# Patient Record
Sex: Female | Born: 1960 | Race: Black or African American | Hispanic: No | Marital: Married | Smoking: Current every day smoker
Health system: Southern US, Community
[De-identification: ages and names within clinical notes are randomized; demographics above are authoritative.]

## PROBLEM LIST (undated history)

## (undated) DIAGNOSIS — I10 Essential (primary) hypertension: Secondary | ICD-10-CM

## (undated) DIAGNOSIS — G459 Transient cerebral ischemic attack, unspecified: Secondary | ICD-10-CM

## (undated) DIAGNOSIS — Z720 Tobacco use: Secondary | ICD-10-CM

## (undated) HISTORY — DX: Essential (primary) hypertension: I10

## (undated) HISTORY — PX: OTHER SURGICAL HISTORY: SHX169

## (undated) HISTORY — DX: Transient cerebral ischemic attack, unspecified: G45.9

---

## 1998-03-04 ENCOUNTER — Emergency Department (HOSPITAL_COMMUNITY): Admission: EM | Admit: 1998-03-04 | Discharge: 1998-03-04 | Payer: Self-pay | Admitting: Emergency Medicine

## 1999-02-23 ENCOUNTER — Encounter: Admission: RE | Admit: 1999-02-23 | Discharge: 1999-02-23 | Payer: Self-pay | Admitting: Hematology and Oncology

## 1999-07-03 ENCOUNTER — Encounter: Admission: RE | Admit: 1999-07-03 | Discharge: 1999-07-03 | Payer: Self-pay | Admitting: Internal Medicine

## 1999-11-28 ENCOUNTER — Encounter: Payer: Self-pay | Admitting: Emergency Medicine

## 1999-11-28 ENCOUNTER — Emergency Department (HOSPITAL_COMMUNITY): Admission: EM | Admit: 1999-11-28 | Discharge: 1999-11-28 | Payer: Self-pay | Admitting: Emergency Medicine

## 1999-12-02 ENCOUNTER — Emergency Department (HOSPITAL_COMMUNITY): Admission: EM | Admit: 1999-12-02 | Discharge: 1999-12-02 | Payer: Self-pay | Admitting: Emergency Medicine

## 1999-12-02 ENCOUNTER — Encounter: Payer: Self-pay | Admitting: Emergency Medicine

## 2000-07-10 ENCOUNTER — Emergency Department (HOSPITAL_COMMUNITY): Admission: EM | Admit: 2000-07-10 | Discharge: 2000-07-10 | Payer: Self-pay | Admitting: Emergency Medicine

## 2000-12-03 ENCOUNTER — Encounter: Admission: RE | Admit: 2000-12-03 | Discharge: 2000-12-03 | Payer: Self-pay | Admitting: Hematology and Oncology

## 2000-12-24 ENCOUNTER — Encounter: Admission: RE | Admit: 2000-12-24 | Discharge: 2000-12-24 | Payer: Self-pay | Admitting: Obstetrics & Gynecology

## 2000-12-24 ENCOUNTER — Other Ambulatory Visit: Admission: RE | Admit: 2000-12-24 | Discharge: 2000-12-24 | Payer: Self-pay | Admitting: *Deleted

## 2012-04-20 DIAGNOSIS — G459 Transient cerebral ischemic attack, unspecified: Secondary | ICD-10-CM

## 2012-04-20 HISTORY — DX: Transient cerebral ischemic attack, unspecified: G45.9

## 2013-06-11 ENCOUNTER — Ambulatory Visit: Payer: No Typology Code available for payment source | Attending: Internal Medicine

## 2013-07-01 ENCOUNTER — Encounter: Payer: Self-pay | Admitting: Internal Medicine

## 2013-07-01 ENCOUNTER — Ambulatory Visit: Payer: Self-pay | Attending: Internal Medicine | Admitting: Internal Medicine

## 2013-07-01 VITALS — BP 149/97 | HR 67 | Temp 98.7°F | Resp 16 | Ht 65.0 in | Wt 200.0 lb

## 2013-07-01 DIAGNOSIS — F17200 Nicotine dependence, unspecified, uncomplicated: Secondary | ICD-10-CM | POA: Insufficient documentation

## 2013-07-01 DIAGNOSIS — I10 Essential (primary) hypertension: Secondary | ICD-10-CM

## 2013-07-01 DIAGNOSIS — Z0000 Encounter for general adult medical examination without abnormal findings: Secondary | ICD-10-CM

## 2013-07-01 MED ORDER — ASPIRIN 81 MG PO TABS: 81.0000 mg | ORAL_TABLET | Freq: Every day | ORAL | Status: DC

## 2013-07-01 MED ORDER — CARVEDILOL 6.25 MG PO TABS: 6.2500 mg | ORAL_TABLET | Freq: Two times a day (BID) | ORAL | Status: DC

## 2013-07-01 MED ORDER — LOSARTAN POTASSIUM 100 MG PO TABS: 100.0000 mg | ORAL_TABLET | Freq: Every day | ORAL | Status: DC

## 2013-07-01 MED ORDER — AMLODIPINE BESYLATE 10 MG PO TABS: 10.0000 mg | ORAL_TABLET | Freq: Every day | ORAL | Status: DC

## 2013-07-01 MED ORDER — HYDROCHLOROTHIAZIDE 25 MG PO TABS: 25.0000 mg | ORAL_TABLET | Freq: Every day | ORAL | Status: DC

## 2013-07-01 NOTE — Progress Notes (Signed)
Pt is here to establish care. Pt reports having HTN and she needs a refill of her HTN medication. Pt has no C.C. Today.

## 2013-07-01 NOTE — Progress Notes (Signed)
Patient Demographics  Veronica Turner, is a 53 y.o. female  [id number]  [id number]  DOB - 01-13-1961  Chief Complaint  Patient presents with  . Establish Care        Subjective:   Veronica Turner today is here to establish primary care. Patient has a past medical history of hypertension, ongoing tobacco use who presents here for establishment of primary care. She just ran out of her antihypertensive medications today, and needs refill as well. She has no problems, she recently moved to [place] from [place].  Currently patient has no complaints. Patient has also has No headache, No chest pain, No abdominal pain,No Nausea, No new weakness tingling or numbness, No Cough or SOB.   Objective:    Filed Vitals:   07/01/13 1440  BP: 149/97  Pulse: 67  Temp: 98.7 F (37.1 C)  TempSrc: Oral  Resp: 16  Height: 5\' 5"  (1.651 m)  Weight: 200 lb (90.719 kg)  SpO2: 99%     ALLERGIES:   Allergies  Allergen Reactions  . Penicillins     PAST MEDICAL HISTORY: History reviewed. No pertinent past medical history.  PAST SURGICAL HISTORY: Past Surgical History  Procedure Laterality Date  . Cesarean section      FAMILY HISTORY: Family History  Problem Relation Age of Onset  . Heart disease Mother   . Hypertension Mother     MEDICATIONS AT HOME: Prior to Admission medications   Medication Sig Start Date End Date Taking? Authorizing Provider  amLODipine (NORVASC) 10 MG tablet Take 1 tablet (10 mg total) by mouth daily. 07/01/13   Oretta Berkland Levora Dredge, MD  aspirin 81 MG tablet Take 1 tablet (81 mg total) by mouth daily. 07/01/13   Uma Jerde Levora Dredge, MD  carvedilol (COREG) 6.25 MG tablet Take 1 tablet (6.25 mg total) by mouth 2 (two) times daily with a meal. 07/01/13   Elaisha Zahniser Levora Dredge, MD  hydrochlorothiazide (HYDRODIURIL) 25 MG tablet Take 1 tablet (25 mg total) by mouth daily. 07/01/13   Atha Mcbain Levora Dredge, MD  losartan (COZAAR) 100 MG tablet Take 1 tablet (100 mg  total) by mouth daily. 07/01/13   Lamarius Dirr Levora Dredge, MD    SOCIAL HISTORY:   reports that she has been smoking Cigarettes.  She has been smoking about 0.50 packs per day. She does not have any smokeless tobacco history on file. She reports that she drinks about 0.5 ounces of alcohol per week. She reports that she does not use illicit drugs.  REVIEW OF SYSTEMS:  Constitutional:   No   Fevers, chills, fatigue.  HEENT:    No headaches, Sore throat,   Cardio-vascular: No chest pain,  Orthopnea, swelling in lower extremities, anasarca, palpitations  GI:  No abdominal pain, nausea, vomiting, diarrhea  Resp: No shortness of breath,  No coughing up of blood.No cough.No wheezing.  Skin:  no rash or lesions.  GU:  no dysuria, change in color of urine, no urgency or frequency.  No flank pain.  Musculoskeletal: No joint pain or swelling.  No decreased range of motion.  No back pain.  Psych: No change in mood or affect. No depression or anxiety.  No memory loss.   Exam  General appearance :Awake, alert, not in any distress. Speech Clear. Not toxic Looking HEENT: Atraumatic and Normocephalic, pupils equally reactive to light and accomodation Neck: supple, no JVD. No cervical lymphadenopathy.  Chest:Good air entry bilaterally, no added sounds  CVS: S1 S2 regular, no murmurs.  Abdomen: Bowel sounds present,  Non tender and not distended with no gaurding, rigidity or rebound. Extremities: B/L Lower Ext shows no edema, both legs are warm to touch Neurology: Awake alert, and oriented X 3, CN II-XII intact, Non focal Skin:No Rash Wounds:N/A    Data Review   CBC No results found for this basename: WBC, HGB, HCT, PLT, MCV, MCH, MCHC, RDW, NEUTRABS, LYMPHSABS, MONOABS, EOSABS, BASOSABS, BANDABS, BANDSABD,  in the last 168 hours  Chemistries   No results found for this basename: NA, K, CL, CO2, GLUCOSE, BUN, CREATININE, GFRCGP, CALCIUM, MG, AST, ALT, ALKPHOS, BILITOT,  in the last 168  hours ------------------------------------------------------------------------------------------------------------------ No results found for this basename: HGBA1C,  in the last 72 hours ------------------------------------------------------------------------------------------------------------------ No results found for this basename: CHOL, HDL, LDLCALC, TRIG, CHOLHDL, LDLDIRECT,  in the last 72 hours ------------------------------------------------------------------------------------------------------------------ No results found for this basename: TSH, T4TOTAL, FREET3, T3FREE, THYROIDAB,  in the last 72 hours ------------------------------------------------------------------------------------------------------------------ No results found for this basename: VITAMINB12, FOLATE, FERRITIN, TIBC, IRON, RETICCTPCT,  in the last 72 hours  Coagulation profile  No results found for this basename: INR, PROTIME,  in the last 168 hours    Assessment & Plan   Hypertension - Refill losartan 100 mg daily, refill HCTZ 25 mg daily, refill amlodipine 10 mg daily, increase Coreg to 6.25 mg daily - Reassess next visit and adjust according  Tobacco use - Counseled regarding the importance of quitting  Has family history of premature coronary artery disease - Claims  stress test done recently in [place] in June of this year was negative. - Claims echocardiogram done recently in [place] in June of this year was negative for major abnormalities - Maintained on aspirin  May need referral to cardiology at some point.  For now will check CBC, chemistries, lipid panel, A1c and TSH and have a followup in 3 weeks. Please check these tests at next visit  Health Maintenance -Colonoscopy: Refer to gastroenterology -Pap Smear: Refer to GYN -Mammogram: Have ordered -Vaccinations: Flu vaccine ordered today- however patient refused  Follow up in 3 weeks  The patient was given clear instructions to go to  ER or return to medical center if symptoms don't improve, worsen or new problems develop. The patient verbalized understanding. The patient was told to call to get lab results if they haven't heard anything in the next week.

## 2013-07-06 ENCOUNTER — Other Ambulatory Visit: Payer: Self-pay | Admitting: Internal Medicine

## 2013-07-06 ENCOUNTER — Other Ambulatory Visit: Payer: Self-pay

## 2013-07-06 DIAGNOSIS — Z1231 Encounter for screening mammogram for malignant neoplasm of breast: Secondary | ICD-10-CM

## 2013-07-08 ENCOUNTER — Encounter: Payer: Self-pay | Admitting: Nurse Practitioner

## 2013-07-08 ENCOUNTER — Other Ambulatory Visit: Payer: Self-pay

## 2013-07-31 ENCOUNTER — Ambulatory Visit: Payer: Self-pay

## 2013-08-03 ENCOUNTER — Encounter: Payer: Self-pay | Admitting: Nurse Practitioner

## 2013-08-03 ENCOUNTER — Ambulatory Visit (INDEPENDENT_AMBULATORY_CARE_PROVIDER_SITE_OTHER): Payer: Self-pay | Admitting: Nurse Practitioner

## 2013-08-03 VITALS — BP 130/94 | HR 81 | Temp 98.0°F | Ht 64.0 in | Wt 197.2 lb

## 2013-08-03 DIAGNOSIS — B009 Herpesviral infection, unspecified: Secondary | ICD-10-CM

## 2013-08-03 DIAGNOSIS — I10 Essential (primary) hypertension: Secondary | ICD-10-CM

## 2013-08-03 DIAGNOSIS — N760 Acute vaginitis: Secondary | ICD-10-CM

## 2013-08-03 DIAGNOSIS — A609 Anogenital herpesviral infection, unspecified: Secondary | ICD-10-CM

## 2013-08-03 DIAGNOSIS — E669 Obesity, unspecified: Secondary | ICD-10-CM

## 2013-08-03 DIAGNOSIS — Z0000 Encounter for general adult medical examination without abnormal findings: Secondary | ICD-10-CM

## 2013-08-03 DIAGNOSIS — F17200 Nicotine dependence, unspecified, uncomplicated: Secondary | ICD-10-CM

## 2013-08-03 MED ORDER — FLUCONAZOLE 150 MG PO TABS: 150.0000 mg | ORAL_TABLET | Freq: Every day | ORAL | Status: DC

## 2013-08-03 NOTE — Progress Notes (Signed)
History:  Veronica Turner is a 52 y.o. [id number] who presents to [facility] clinic today for pap smear. She gets a yearly pap. She still has monthly menses. She has had a full physical exam at [facility]. She has not gotten her labs done yet. She has a history of hypertension and they control her medications. She smokes 3 cigarettes per day. Her last mammogram was last week and negative. She is married with same partner for 35 years. Two weeks ago she felt tingling and itch in her vaginal area. It was not very painful.  Husband does have cold sores.  The following portions of the patient's history were reviewed and updated as appropriate: allergies, current medications, past family history, past medical history, past social history, past surgical history and problem list.  Review of Systems:  Pertinent items are noted in HPI.  Objective:  Physical Exam BP 130/94  Pulse 81  Temp(Src) 98 F (36.7 C) (Oral)  Ht 5\' 4"  (1.626 m)  Wt 197 lb 3.2 oz (89.449 kg)  BMI 33.83 kg/m2  LMP 07/24/2013 GENERAL: Well-developed, well-nourished female in no acute distress.  HEENT: Normocephalic, atraumatic.  NECK: Supple. Normal thyroid.  LUNGS: Normal rate. Clear to auscultation bilaterally.  HEART: Regular rate and rhythm with no adventitious sounds.  BREASTS: Symmetric in size. No masses, skin changes, nipple drainage, or lymphadenopathy. ABDOMEN: Soft, nontender, nondistended. No organomegaly. Normal bowel sounds appreciated in all quadrants.  PELVIC: External labia there are on right side approx 4 mm cuts/ blisters. Vagina is pink and rugated.  Normal discharge. Normal cervix contour. Pap smear obtained. Uterus is normal in size. No adnexal mass or tenderness.  EXTREMITIES: No cyanosis, clubbing, or edema, 2+ distal pulses.   Labs and Imaging No results found.  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 Vaginal yeast    Plans:  Pt insists on  blood for HSV typing Will await results  Diflucan 150 mg ome time dose   Delbert Phenix, NP 08/03/2013 4:26 PM

## 2013-08-03 NOTE — Patient Instructions (Signed)
Herpes Labialis  You have a fever blister or cold sore (herpes labialis). These painful, grouped sores are caused by one of the herpes viruses (HSV1 most commonly). They are usually found around the lips and mouth, but the same infection can also affect other areas on the face such as the nose and eyes. Herpes infections take about 10 days to heal. They often occur again and again in the same spot. Other symptoms may include numbness and tingling in the involved skin, achiness, fever, and swollen glands in the neck. Colds, emotional stress, injuries, or excess sunlight exposure all seem to make herpes reappear. Herpes lip infections are contagious. Direct contact with these sores can spread the infection. It can also be spread to other parts of your own body.  TREATMENT   Herpes labialis is usually self-limited and resolves within 1 week. To reduce pain and swelling, apply ice packs frequently to the sores or suck on popsicles or frozen juice bars. Antiviral medicine may be used by mouth to shorten the duration of the breakout. Avoid spreading the infection by washing your hands often. Be careful not to touch your eyes or genital areas after handling the infected blisters. Do not kiss or have other intimate contact with others. After the blisters are completely healed you may resume contact. Use sunscreen to lessen recurrences.   If this is your first infection with herpes, or if you have a severe or repeated infections, your caregiver may prescribe one of the anti-viral drugs to speed up the healing. If you have sun-related flare-ups despite the use of sunscreen, starting oral anti-viral medicine before a prolonged exposure (going skiing or to the beach) can prevent most episodes.   SEEK IMMEDIATE MEDICAL CARE IF:  · You develop a headache, sleepiness, high fever, vomiting, or severe weakness.  · You have eye irritation, pain, blurred vision or redness.  · You develop a prolonged infection not getting better in 10  days.  Document Released: 08/06/2005 Document Revised: 10/29/2011 Document Reviewed: 06/10/2009  Exit[employer]® Patient Information ©2014 [employer].

## 2013-08-04 LAB — HSV(HERPES SMPLX)ABS-I+II(IGG+IGM)-BLD
HSV 1 Glycoprotein G Ab, IgG: 6.25 IV — ABNORMAL HIGH
Herpes Simplex Vrs I&II-IgM Ab (EIA): 0.53 INDEX

## 2013-08-05 ENCOUNTER — Ambulatory Visit
Admission: RE | Admit: 2013-08-05 | Discharge: 2013-08-05 | Disposition: A | Discharge status: Home / Self Care | Payer: No Typology Code available for payment source | Source: Ambulatory Visit | Attending: Internal Medicine | Admitting: Internal Medicine

## 2013-08-05 ENCOUNTER — Telehealth: Payer: Self-pay | Admitting: General Practice

## 2013-08-05 ENCOUNTER — Telehealth: Payer: Self-pay | Admitting: Nurse Practitioner

## 2013-08-05 DIAGNOSIS — Z1231 Encounter for screening mammogram for malignant neoplasm of breast: Secondary | ICD-10-CM

## 2013-08-05 DIAGNOSIS — A609 Anogenital herpesviral infection, unspecified: Secondary | ICD-10-CM

## 2013-08-05 MED ORDER — VALACYCLOVIR HCL 1 G PO TABS: 1000.0000 mg | ORAL_TABLET | Freq: Two times a day (BID) | ORAL | Status: AC

## 2013-08-05 NOTE — Telephone Encounter (Signed)
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 Will send in rx for Valtrex 1 Gram po BID x 5 days with refills. Information on HSV provided

## 2013-08-05 NOTE — Telephone Encounter (Signed)
Patient called and left message stating she spoke with linda barefoot this morning and she was supposed to be calling in a medication for her but she went to her pharmacy and it wasn't there, would like a call back. Per chart review, it appears medication Rx printed. Called pharmacy and phoned in medication. Called patient, no answer- left message stating I was returning her phone call and have called in her Rx that should be available to pickup in a hour and I apologize for the inconvenience and to call us back if she has additional questions or concerns

## 2013-08-27 ENCOUNTER — Encounter: Payer: Self-pay | Admitting: Internal Medicine

## 2014-01-15 ENCOUNTER — Ambulatory Visit: Payer: Self-pay | Attending: Internal Medicine

## 2014-01-16 ENCOUNTER — Emergency Department (HOSPITAL_COMMUNITY)
Admission: EM | Admit: 2014-01-16 | Discharge: 2014-01-16 | Disposition: A | Discharge status: Home / Self Care | Payer: Self-pay | Source: Home / Self Care

## 2014-01-16 ENCOUNTER — Encounter (HOSPITAL_COMMUNITY): Payer: Self-pay | Admitting: Emergency Medicine

## 2014-01-16 DIAGNOSIS — W268XXA Contact with other sharp object(s), not elsewhere classified, initial encounter: Secondary | ICD-10-CM

## 2014-01-16 DIAGNOSIS — S60929A Unspecified superficial injury of unspecified hand, initial encounter: Secondary | ICD-10-CM

## 2014-01-16 DIAGNOSIS — Z23 Encounter for immunization: Secondary | ICD-10-CM

## 2014-01-16 DIAGNOSIS — S61411A Laceration without foreign body of right hand, initial encounter: Secondary | ICD-10-CM

## 2014-01-16 DIAGNOSIS — Y93E9 Activity, other interior property and clothing maintenance: Secondary | ICD-10-CM

## 2014-01-16 MED ORDER — TETANUS-DIPHTH-ACELL PERTUSSIS 5-2.5-18.5 LF-MCG/0.5 IM SUSP
0.5000 mL | Freq: Once | INTRAMUSCULAR | Status: AC
  Administered 2014-01-16: 0.5 mL via INTRAMUSCULAR

## 2014-01-16 MED ORDER — TETANUS-DIPHTH-ACELL PERTUSSIS 5-2.5-18.5 LF-MCG/0.5 IM SUSP
INTRAMUSCULAR | Status: AC
  Filled 2014-01-16: qty 0.5

## 2014-01-16 NOTE — ED Notes (Signed)
Pt states that the incident happened last night.   Mw,cma

## 2014-01-16 NOTE — ED Provider Notes (Signed)
CSN: [id number]     Arrival date & time 01/16/14  1245 History   First MD Initiated Contact with Patient 01/16/14 1406     Chief Complaint  Patient presents with  . Laceration   (Consider location/radiation/quality/duration/timing/severity/associated sxs/prior Treatment) HPI Comments: While cleaning windows at 2 AM today the glass broke and she received a superficial laceration to the right hand and abrasion to right wrist. Does not recall last tetanus vac.   Past Medical History  Diagnosis Date  . Hypertension    Past Surgical History  Procedure Laterality Date  . Cesarean section     Family History  Problem Relation Age of Onset  . Heart disease Mother   . Hypertension Mother    History  Substance Use Topics  . Smoking status: Current Every Day Smoker -- 0.50 packs/day    Types: Cigarettes  . Smokeless tobacco: Not on file  . Alcohol Use: 0.5 oz/week    1 drink(s) per week   OB History   Grav Para Term Preterm Abortions TAB SAB Ect Mult Living   7 5 3 2 2  0 2 0 0 5     Review of Systems  Constitutional: Negative.   Respiratory: Negative.   Skin: Positive for wound.       As per HPI    Allergies  Penicillins  Home Medications   Prior to Admission medications   Medication Sig Start Date End Date Taking? Authorizing Provider  amLODipine (NORVASC) 10 MG tablet Take 1 tablet (10 mg total) by mouth daily. 07/01/13  Yes Shanker Levora DredgeM Ghimire, MD  aspirin 81 MG tablet Take 1 tablet (81 mg total) by mouth daily. 07/01/13  Yes Shanker Levora DredgeM Ghimire, MD  carvedilol (COREG) 6.25 MG tablet Take 1 tablet (6.25 mg total) by mouth 2 (two) times daily with a meal. 07/01/13  Yes Shanker Levora DredgeM Ghimire, MD  fluconazole (DIFLUCAN) 150 MG tablet Take 1 tablet (150 mg total) by mouth daily. 08/03/13  Yes Delbert PhenixLinda M Barefoot, NP  hydrochlorothiazide (HYDRODIURIL) 25 MG tablet Take 1 tablet (25 mg total) by mouth daily. 07/01/13  Yes Shanker Levora DredgeM Ghimire, MD  losartan (COZAAR) 100 MG tablet Take 1  tablet (100 mg total) by mouth daily. 07/01/13  Yes Shanker Levora DredgeM Ghimire, MD   BP 209/125  Pulse 71  Temp(Src) 98 F (36.7 C) (Oral)  Resp 18  SpO2 96%  LMP 12/17/2013 Physical Exam  Nursing note and vitals reviewed. Constitutional: She is oriented to person, place, and time. She appears well-developed and well-nourished. No distress.  Musculoskeletal: She exhibits no edema and no tenderness.  Full ROM of the R hand, digits. 5th digit with full extension against resistance  Neurological: She is alert and oriented to person, place, and time.  Skin: Skin is warm and dry.  1.25 cm superficial laceration to the dorsum of the right hand proximal to the 5th digit. Involves dermis only.  Superficial abrasion to ulnar aspect right wrist. No bleeding.   Psychiatric: She has a normal mood and affect.    ED Course  LACERATION REPAIR Date/Time: 01/16/2014 2:31 PM Performed by: Phineas RealMABE, Nioma Mccubbins Authorized by: Leslee HomeKELLER, Etan Vasudevan C Consent: Verbal consent obtained. Risks and benefits: risks, benefits and alternatives were discussed Consent given by: patient Patient understanding: patient states understanding of the procedure being performed Patient identity confirmed: verbally with patient Body area: upper extremity Location details: right hand Laceration length: 1.3 cm Tendon involvement: none Nerve involvement: none Vascular damage: no Patient sedated: no Irrigation solution: tap water  Amount of cleaning: standard Debridement: none Degree of undermining: none Skin closure: glue Technique: simple Approximation: close Approximation difficulty: simple Patient tolerance: Patient tolerated the procedure well with no immediate complications. Comments: After betadine soak for 10 min applied glue for closure.   (including critical care time) Labs Review Labs Reviewed - No data to display  Imaging Review No results found.   MDM   1. Superficial laceration of right hand     Wound  cleaned Closed with glue INstructions for care. Tdap ordered    Hayden Rasmussen, NP 01/16/14 1438

## 2014-01-16 NOTE — ED Notes (Signed)
Reports to lacerations to the right hand pinky side.  States was cleaning windows and glass broke.  Pt is up to date on tetanus.

## 2014-01-17 NOTE — ED Provider Notes (Signed)
Medical screening examination/treatment/procedure(s) were performed by non-physician practitioner and as supervising physician I was immediately available for consultation/collaboration.  Leslee Home, M.D.  Reuben Likes, MD 01/17/14 [phone]

## 2014-01-20 ENCOUNTER — Ambulatory Visit: Payer: Self-pay | Attending: Internal Medicine | Admitting: Internal Medicine

## 2014-01-20 ENCOUNTER — Encounter: Payer: Self-pay | Admitting: Internal Medicine

## 2014-01-20 VITALS — BP 167/95 | HR 75 | Temp 98.1°F | Resp 20 | Ht 64.0 in | Wt 203.0 lb

## 2014-01-20 DIAGNOSIS — I209 Angina pectoris, unspecified: Secondary | ICD-10-CM | POA: Insufficient documentation

## 2014-01-20 DIAGNOSIS — Z7982 Long term (current) use of aspirin: Secondary | ICD-10-CM | POA: Insufficient documentation

## 2014-01-20 DIAGNOSIS — F17200 Nicotine dependence, unspecified, uncomplicated: Secondary | ICD-10-CM | POA: Insufficient documentation

## 2014-01-20 DIAGNOSIS — Z8673 Personal history of transient ischemic attack (TIA), and cerebral infarction without residual deficits: Secondary | ICD-10-CM | POA: Insufficient documentation

## 2014-01-20 DIAGNOSIS — I10 Essential (primary) hypertension: Secondary | ICD-10-CM | POA: Insufficient documentation

## 2014-01-20 MED ORDER — ISOSORBIDE MONONITRATE ER 30 MG PO TB24: 30.0000 mg | ORAL_TABLET | Freq: Every day | ORAL | Status: DC

## 2014-01-20 MED ORDER — BUPROPION HCL ER (SR) 150 MG PO TB12: 150.0000 mg | ORAL_TABLET | Freq: Every day | ORAL | Status: DC

## 2014-01-20 NOTE — Patient Instructions (Signed)
Angina Pectoris  Angina pectoris, often just called angina, is extreme discomfort in your chest, neck, or arm caused by a lack of blood in the middle and thickest layer of your heart wall (myocardium). It may feel like tightness or heavy pressure. It may feel like a crushing or squeezing pain. Some people say it feels like gas or indigestion. It may go down your shoulders, back, and arms. Some people may have symptoms other than pain. These symptoms include fatigue, shortness of breath, cold sweats, or nausea. There are four different types of angina:   Stable angina Stable angina usually occurs in episodes of predictable frequency and duration. It usually is brought on by physical activity, emotional stress, or excitement. These are all times when the myocardium needs more oxygen. Stable angina usually lasts a few minutes and often is relieved by taking a medicine that can be taken under your tongue (sublingually). The medicine is called nitroglycerin. Stable angina is caused by a buildup of plaque inside the arteries, which restricts blood flow to the heart muscle (atherosclerosis).   Unstable angina Unstable angina can occur even when your body experiences little or no physical exertion. It can occur during sleep. It can also occur at rest. It can suddenly increase in severity or frequency. It might not be relieved by sublingual nitroglycerin. It can last up to 30 minutes. The most common cause of unstable angina is a blood clot that has developed on the top of plaque buildup inside a coronary artery. It can lead to a heart attack if the blood clot completely blocks the artery.   Microvascular angina This type of angina is caused by a disorder of tiny blood vessels called arterioles. Microvascular angina is more common in women. The pain may be more severe and last longer than other types of angina pectoris.   Prinzmetal or variant angina This type of angina pectoris usually occurs when your body experiences  little or no physical exertion. It especially occurs in the early morning hours. It is caused by a spasm of your coronary artery.  HOME CARE INSTRUCTIONS    Only take over-the-counter and prescription medicines as directed by your caregiver.   Stay active or increase your exercise as directed by your caregiver.   Limit strenuous activity as directed by your caregiver.   Limit heavy lifting as directed by your caregiver.   Maintain a healthy weight.   Learn about and eat heart-healthy foods.   Do not smoke.  SEEK IMMEDIATE MEDICAL CARE IF:   You experience the following symptoms:   Chest, neck, deep shoulder, or arm pain or discomfort that lasts more than a few minutes.   Chest, neck, deep shoulder, or arm pain or discomfort that goes away and comes back, repeatedly.   Heavy sweating with discomfort, without a noticeable cause.   Shortness of breath or difficulty breathing.   Angina that does not get better after a few minutes of rest or after taking sublingual nitroglycerin.  These can all be symptoms of a heart attack, which is a medical emergency! Get medical help at once. Call your local emergency service (911 in [place] immediately. Do not  drive yourself to the hospital and do not  wait to for your symptoms to go away.  MAKE SURE YOU:   Understand these instructions.   Will watch your condition.   Will get help right away if you are not doing well or get worse.  Document Released: 08/06/2005 Document Revised: 07/23/2012 Document Reviewed:   05/15/2012  Exit[employer] Patient Information 2014 [employer].

## 2014-01-20 NOTE — Progress Notes (Signed)
Patient ID: Veronica Turner, female   DOB: 1961-04-15, 53 y.o.   MRN: [id number]  CC: HTN, intermittent chest pain  HPI: Patient presents today for HTNand intermittent chest pain.  Patient reports chest heaviness with ambulation.  She states the pain began one week ago and it has been hurting intermittently on a daily basis.  She notices the pain and shortness of breath while doing house work. She reports some sharp pains that radiate to her back that are promptly relieved with rest and ibuprofen.  She is a current smoker.  She reports that she has been experiencing more stress over the past few weeks and is worried because her mom passed away from a MI at a young age.  She states that she was being followed by a Cardiologist before moving to [place] and had similar symptoms in the past and was started on Coreg which relieved the chest pain.   She also reports a history of a TIA last September before moving here.  She takes 81 mg of aspirin daily.    She c/o nasal discharge and cough with thick white mucous production. Denies fevers or chills.  Allergies  Allergen Reactions  . Penicillins    Past Medical History  Diagnosis Date  . Hypertension   . TIA (transient ischemic attack) 04/20/12   Current Outpatient Prescriptions on File Prior to Visit  Medication Sig Dispense Refill  . amLODipine (NORVASC) 10 MG tablet Take 1 tablet (10 mg total) by mouth daily.  90 tablet  3  . aspirin 81 MG tablet Take 1 tablet (81 mg total) by mouth daily.  30 tablet  3  . carvedilol (COREG) 6.25 MG tablet Take 1 tablet (6.25 mg total) by mouth 2 (two) times daily with a meal.  60 tablet  3  . hydrochlorothiazide (HYDRODIURIL) 25 MG tablet Take 1 tablet (25 mg total) by mouth daily.  90 tablet  3  . losartan (COZAAR) 100 MG tablet Take 1 tablet (100 mg total) by mouth daily.  90 tablet  3  . fluconazole (DIFLUCAN) 150 MG tablet Take 1 tablet (150 mg total) by mouth daily.  1 tablet  1   No current facility-administered  medications on file prior to visit.   Family History  Problem Relation Age of Onset  . Heart disease Mother   . Hypertension Mother   . Cancer Father    History   Social History  . Marital Status: Married    Spouse Name: N/A    Number of Children: N/A  . Years of Education: N/A   Occupational History  . Not on file.   Social History Main Topics  . Smoking status: Current Every Day Smoker -- 0.50 packs/day    Types: Cigarettes  . Smokeless tobacco: Not on file  . Alcohol Use: 0.5 oz/week    1 drink(s) per week  . Drug Use: No  . Sexual Activity: Not on file   Other Topics Concern  . Not on file   Social History Narrative  . No narrative on file    Review of Systems: See HPI   Objective:   Filed Vitals:   01/20/14 0949  BP: 167/95  Pulse: 75  Temp: 98.1 F (36.7 C)  Resp: 20   Physical Exam  Vitals reviewed. Constitutional: She is oriented to person, place, and time. She appears well-nourished.  HENT:  Right Ear: External ear normal.  Left Ear: External ear normal.  Nose: Nose normal.  Mouth/Throat: Oropharynx is clear  and moist.  Eyes: Pupils are equal, round, and reactive to light.  Neck: Normal range of motion. Neck supple. No JVD present.  Cardiovascular: Normal rate, regular rhythm, normal heart sounds and intact distal pulses.   Pulmonary/Chest: Effort normal and breath sounds normal.  Abdominal: Soft. Bowel sounds are normal.  Musculoskeletal: Normal range of motion. She exhibits no edema and no tenderness.  Lymphadenopathy:    She has no cervical adenopathy.  Neurological: She is alert and oriented to person, place, and time. She has normal reflexes.  Skin: Skin is warm and dry.    No results found for this basename: WBC, HGB, HCT, MCV, PLT   No results found for this basename: CREATININE, BUN, NA, K, CL, CO2    No results found for this basename: HGBA1C   Lipid Panel  No results found for this basename: chol, trig, hdl, cholhdl,  vldl, ldlcalc       Assessment and plan:  Drinda Buttsnnette was seen today for follow-up, hypertension, chest pain and back pain.  Diagnoses and associated orders for this visit:  Other and unspecified angina pectoris Will add isosorbide mononitrate (IMDUR) 30 MG 24 hr tablet; Take 1 tablet (30 mg total) by mouth daily. Will follow up with Dr. Daleen SquibbWall next week for further evaluation.  Will reassess BP at that time as well.  HTN (hypertension) - Continue current medication regimen  Smoking - buPROPion (WELLBUTRIN SR) 150 MG 12 hr tablet; Take 1 tablet (150 mg total) by mouth daily.    Return for 1 week with Dr. Daleen SquibbWall, 1 month with PCP.       Ambrose FinlandValerie A Keck, NP-C [facility] [phone] 01/20/2014, 10:10 AM

## 2014-01-20 NOTE — Progress Notes (Signed)
Patient is here for 7 day history of chest pain with movement.  Also C/O low back pain for 3 days after lifting. States she would like to quit smoking.

## 2014-01-27 ENCOUNTER — Other Ambulatory Visit: Payer: Self-pay

## 2014-01-27 ENCOUNTER — Ambulatory Visit: Payer: Self-pay | Attending: Cardiology | Admitting: Cardiology

## 2014-01-27 ENCOUNTER — Ambulatory Visit (HOSPITAL_COMMUNITY)
Admission: RE | Admit: 2014-01-27 | Discharge: 2014-01-27 | Disposition: A | Discharge status: Home / Self Care | Payer: Self-pay | Source: Ambulatory Visit | Attending: Cardiology | Admitting: Cardiology

## 2014-01-27 ENCOUNTER — Encounter: Payer: Self-pay | Admitting: Cardiology

## 2014-01-27 VITALS — BP 131/79 | HR 80 | Temp 99.0°F | Resp 20 | Ht 64.0 in | Wt 202.0 lb

## 2014-01-27 DIAGNOSIS — I209 Angina pectoris, unspecified: Secondary | ICD-10-CM | POA: Insufficient documentation

## 2014-01-27 DIAGNOSIS — I10 Essential (primary) hypertension: Secondary | ICD-10-CM | POA: Insufficient documentation

## 2014-01-27 DIAGNOSIS — R0989 Other specified symptoms and signs involving the circulatory and respiratory systems: Principal | ICD-10-CM | POA: Insufficient documentation

## 2014-01-27 DIAGNOSIS — Z8673 Personal history of transient ischemic attack (TIA), and cerebral infarction without residual deficits: Secondary | ICD-10-CM | POA: Insufficient documentation

## 2014-01-27 DIAGNOSIS — Z7982 Long term (current) use of aspirin: Secondary | ICD-10-CM | POA: Insufficient documentation

## 2014-01-27 DIAGNOSIS — F17200 Nicotine dependence, unspecified, uncomplicated: Secondary | ICD-10-CM | POA: Insufficient documentation

## 2014-01-27 DIAGNOSIS — E663 Overweight: Secondary | ICD-10-CM | POA: Insufficient documentation

## 2014-01-27 DIAGNOSIS — E669 Obesity, unspecified: Secondary | ICD-10-CM | POA: Insufficient documentation

## 2014-01-27 DIAGNOSIS — R0609 Other forms of dyspnea: Secondary | ICD-10-CM | POA: Insufficient documentation

## 2014-01-27 DIAGNOSIS — I208 Other forms of angina pectoris: Secondary | ICD-10-CM

## 2014-01-27 DIAGNOSIS — R0789 Other chest pain: Secondary | ICD-10-CM | POA: Insufficient documentation

## 2014-01-27 DIAGNOSIS — Z8249 Family history of ischemic heart disease and other diseases of the circulatory system: Secondary | ICD-10-CM | POA: Insufficient documentation

## 2014-01-27 NOTE — Progress Notes (Signed)
Patient here for follow-up of chest pain. Patient indicates she began having chest pain one month ago with activity. She felt chest heaviness. Patient saw Holland Commons on 01/20/14 and was started on Imdur. Patient indicates her chest pain has decreased since she began Imdur. Patient denies chest pain at this time, complaining of daily headaches since she started Imdur and Wellbutrin. Has cut back on smoking since she started taking Wellbutrin.

## 2014-01-27 NOTE — Patient Instructions (Signed)
Your medications have not been changed. Dr. Daleen Squibb would like you to return next week for fasting labwork. Please continue working on smoking cessation.  It was so nice to meet you. Take care!

## 2014-01-27 NOTE — Assessment & Plan Note (Addendum)
She most likely has some coronary artery disease. Symptoms are remarkably improved with low-dose isosorbide as well as cutting back on cigarettes dramatically. Her headache could be nicotine withdrawal and/or isosorbide.  Encouraged to stop smoking altogether. We'll continue amlodipine and isosorbide for coronary blood flow augmentation. Continue aspirin and will increase carvedilol. No change in ARB.  I'll have her return to see me in 3-4 weeks. If she continues to have exertional angina and she'll most likely need a diagnostic cardiac catheterization.

## 2014-01-27 NOTE — Progress Notes (Signed)
HPI Veronica Turner is a 53 year old African American female who comes in today for evaluation of exertional dyspnea and chest tightness. She was started on isosorbide 30 mg a day which is decreased her chest discomfort considerably. She's also cut back on her cigarette usage dramatically with the help of Wellbutrin and will power.  She does have a mild headache. She denies any rest chest pain. She denies any orthopnea, PND or edema. She denies any hemoptysis but has a chronic cough. She is on an ARB but not ACE inhibitor.  There are no labs in the chart including lipids.  Her risk factors for coronary artery disease include weight, hypertension, and tobacco use. He is also family history of coronary disease with her mother.  Past Medical History  Diagnosis Date  . Hypertension   . TIA (transient ischemic attack) 04/20/12    Current Outpatient Prescriptions  Medication Sig Dispense Refill  . amLODipine (NORVASC) 10 MG tablet Take 1 tablet (10 mg total) by mouth daily.  90 tablet  3  . aspirin 81 MG tablet Take 1 tablet (81 mg total) by mouth daily.  30 tablet  3  . buPROPion (WELLBUTRIN SR) 150 MG 12 hr tablet Take 1 tablet (150 mg total) by mouth daily.  60 tablet  1  . carvedilol (COREG) 6.25 MG tablet Take 1 tablet (6.25 mg total) by mouth 2 (two) times daily with a meal.  60 tablet  3  . hydrochlorothiazide (HYDRODIURIL) 25 MG tablet Take 1 tablet (25 mg total) by mouth daily.  90 tablet  3  . isosorbide mononitrate (IMDUR) 30 MG 24 hr tablet Take 1 tablet (30 mg total) by mouth daily.  30 tablet  2  . losartan (COZAAR) 100 MG tablet Take 1 tablet (100 mg total) by mouth daily.  90 tablet  3   No current facility-administered medications for this visit.    Allergies  Allergen Reactions  . Penicillins Rash    Family History  Problem Relation Age of Onset  . Heart disease Mother   . Hypertension Mother   . Cancer Father     History   Social History  . Marital Status: Married   Spouse Name: N/A    Number of Children: N/A  . Years of Education: N/A   Occupational History  . Not on file.   Social History Main Topics  . Smoking status: Current Every Day Smoker -- 0.50 packs/day    Types: Cigarettes  . Smokeless tobacco: Not on file  . Alcohol Use: 0.5 oz/week    1 drink(s) per week  . Drug Use: No  . Sexual Activity: Not on file   Other Topics Concern  . Not on file   Social History Narrative  . No narrative on file    ROS ALL NEGATIVE EXCEPT THOSE NOTED IN HPI  PE  General Appearance: well developed, well nourished in no acute distress, overweight HEENT: symmetrical face, PERRLA, good dentition  Neck: no JVD, thyromegaly, or adenopathy, trachea midline Chest: symmetric without deformity Cardiac: PMI non-displaced, RRR, normal S1, S2, no gallop or murmur Lung: Decreased breath sounds throughout with expiratory rhonchi bilaterally Vascular: all pulses full without bruits  Abdominal: nondistended, nontender, good bowel sounds, no HSM, no bruits Extremities: no cyanosis, clubbing or edema, no sign of DVT, no varicosities  Skin: normal color, no rashes Neuro: alert and oriented x 3, non-focal Pysch: normal affect  EKG Normal sinus rhythm, biatrial enlargement, no ST segment changes. BMET No results found  for this basename: na, k, cl, co2, glucose, bun, creatinine, calcium, gfrnonaa, gfraa    Lipid Panel  No results found for this basename: chol, trig, hdl, cholhdl, vldl, ldlcalc    CBC No results found for this basename: wbc, rbc, hgb, hct, plt, mcv, mch, mchc, rdw, neutrabs, lymphsabs, monoabs, eosabs, basosabs

## 2014-02-03 ENCOUNTER — Other Ambulatory Visit: Payer: Self-pay

## 2014-02-10 DIAGNOSIS — I10 Essential (primary) hypertension: Secondary | ICD-10-CM | POA: Insufficient documentation

## 2014-02-18 ENCOUNTER — Ambulatory Visit: Payer: Self-pay | Admitting: Internal Medicine

## 2014-02-19 ENCOUNTER — Emergency Department (HOSPITAL_COMMUNITY): Payer: Self-pay

## 2014-02-19 ENCOUNTER — Encounter (HOSPITAL_COMMUNITY): Payer: Self-pay | Admitting: Emergency Medicine

## 2014-02-19 ENCOUNTER — Observation Stay (HOSPITAL_COMMUNITY)
Admission: EM | Admit: 2014-02-19 | Discharge: 2014-02-21 | Disposition: A | Discharge status: Home / Self Care | Payer: Self-pay | Attending: Internal Medicine | Admitting: Internal Medicine

## 2014-02-19 DIAGNOSIS — E669 Obesity, unspecified: Secondary | ICD-10-CM

## 2014-02-19 DIAGNOSIS — F17200 Nicotine dependence, unspecified, uncomplicated: Secondary | ICD-10-CM | POA: Insufficient documentation

## 2014-02-19 DIAGNOSIS — R072 Precordial pain: Secondary | ICD-10-CM | POA: Diagnosis present

## 2014-02-19 DIAGNOSIS — R0602 Shortness of breath: Secondary | ICD-10-CM | POA: Insufficient documentation

## 2014-02-19 DIAGNOSIS — R110 Nausea: Secondary | ICD-10-CM | POA: Insufficient documentation

## 2014-02-19 DIAGNOSIS — Z79899 Other long term (current) drug therapy: Secondary | ICD-10-CM | POA: Insufficient documentation

## 2014-02-19 DIAGNOSIS — R61 Generalized hyperhidrosis: Secondary | ICD-10-CM | POA: Insufficient documentation

## 2014-02-19 DIAGNOSIS — R42 Dizziness and giddiness: Secondary | ICD-10-CM | POA: Insufficient documentation

## 2014-02-19 DIAGNOSIS — Z8673 Personal history of transient ischemic attack (TIA), and cerebral infarction without residual deficits: Secondary | ICD-10-CM | POA: Insufficient documentation

## 2014-02-19 DIAGNOSIS — Z880 Allergy status to penicillin: Secondary | ICD-10-CM | POA: Insufficient documentation

## 2014-02-19 DIAGNOSIS — I10 Essential (primary) hypertension: Secondary | ICD-10-CM | POA: Insufficient documentation

## 2014-02-19 DIAGNOSIS — Z7982 Long term (current) use of aspirin: Secondary | ICD-10-CM | POA: Insufficient documentation

## 2014-02-19 DIAGNOSIS — R079 Chest pain, unspecified: Principal | ICD-10-CM | POA: Insufficient documentation

## 2014-02-19 HISTORY — DX: Tobacco use: Z72.0

## 2014-02-19 LAB — CBC
HCT: 47.4 % — ABNORMAL HIGH (ref 36.0–46.0)
Hemoglobin: 16.3 g/dL — ABNORMAL HIGH (ref 12.0–15.0)
MCH: 32 pg (ref 26.0–34.0)
MCHC: 34.4 g/dL (ref 30.0–36.0)
MCV: 92.9 fL (ref 78.0–100.0)
Platelets: 307 10*3/uL (ref 150–400)
RBC: 5.1 MIL/uL (ref 3.87–5.11)
RDW: 13 % (ref 11.5–15.5)
WBC: 5.5 10*3/uL (ref 4.0–10.5)

## 2014-02-19 LAB — BASIC METABOLIC PANEL
Anion gap: 15 (ref 5–15)
BUN: 25 mg/dL — ABNORMAL HIGH (ref 6–23)
CHLORIDE: 98 meq/L (ref 96–112)
CO2: 29 meq/L (ref 19–32)
Calcium: 9.8 mg/dL (ref 8.4–10.5)
Creatinine, Ser: 1 mg/dL (ref 0.50–1.10)
GFR calc non Af Amer: 64 mL/min — ABNORMAL LOW (ref >90)
GFR, EST AFRICAN AMERICAN: 74 mL/min — AB (ref >90)
Glucose, Bld: 81 mg/dL (ref 70–99)
Potassium: 3.3 mEq/L — ABNORMAL LOW (ref 3.7–5.3)
SODIUM: 142 meq/L (ref 137–147)

## 2014-02-19 LAB — I-STAT TROPONIN, ED: TROPONIN I, POC: 0 ng/mL (ref 0.00–0.08)

## 2014-02-19 MED ORDER — NITROGLYCERIN 0.4 MG SL SUBL
0.4000 mg | SUBLINGUAL_TABLET | SUBLINGUAL | Status: AC | PRN
  Administered 2014-02-19 (×3): 0.4 mg via SUBLINGUAL

## 2014-02-19 MED ORDER — ASPIRIN 81 MG PO CHEW
324.0000 mg | CHEWABLE_TABLET | Freq: Once | ORAL | Status: AC
  Administered 2014-02-19: 324 mg via ORAL
  Filled 2014-02-19: qty 4

## 2014-02-19 MED ORDER — SODIUM CHLORIDE 0.9 % IV BOLUS (SEPSIS)
1000.0000 mL | Freq: Once | INTRAVENOUS | Status: AC
  Administered 2014-02-19: 1000 mL via INTRAVENOUS

## 2014-02-19 NOTE — ED Notes (Signed)
Portable CXR at the bedside.

## 2014-02-19 NOTE — ED Notes (Signed)
Dr. Goldston at the bedside. 

## 2014-02-19 NOTE — ED Notes (Signed)
Pt placed in a gown, hooked up to the cardic monitor, bp cuff on and pulse ox on

## 2014-02-19 NOTE — ED Notes (Signed)
Reported left arm numbness to Dr. Criss AlvineGoldston, along with chest pain that started tonight at 9pm.  No deficits noted on NIH scale.  MD acknowledges, no need for code stroke at this time.  MD advises to complete portable CXR at this time.

## 2014-02-19 NOTE — ED Provider Notes (Signed)
CSN: [id number]     Arrival date & time 02/19/14  2143 History   First MD Initiated Contact with Patient 02/19/14 2201     Chief Complaint  Patient presents with  . Chest Pain     (Consider location/radiation/quality/duration/timing/severity/associated sxs/prior Treatment) HPI 53 year old female presents with chest pressure and tightness started a few hours prior to arrival. She was at work doing minimal activity when this started. For the past month she's been having exertional chest pressure. She was referred to Dr. wall by her PCP. He put her on isosorbide and she's been taking this. Her chest pain is significantly improved and she hasn't much less frequently. She's never had it at rest, only with exertion. However tonight it started at rest and has not relieved as it normally would. She did not take her Imdur today is directed because she's been having bilateral leg heaviness and she started a month ago. She took it today after her pain started with only minimal relief. At this point in the shower as she states she has only minimal pain. She's also having dizziness, shortness of breath, and left arm numbness during this time.  Past Medical History  Diagnosis Date  . Hypertension   . TIA (transient ischemic attack) 04/20/12   Past Surgical History  Procedure Laterality Date  . Cesarean section    . 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 Heart disease Mother   . Hypertension Mother   . Cancer Father    History  Substance Use Topics  . Smoking status: Current Every Day Smoker -- 0.50 packs/day    Types: Cigarettes  . Smokeless tobacco: Not on file  . Alcohol Use: 0.5 oz/week    1 drink(s) per week   OB History   Grav Para Term Preterm Abortions TAB SAB Ect Mult Living   7 5 3 2 2  0 2 0 0 5     Review of Systems  Constitutional: Positive for diaphoresis. Negative for fever.  Respiratory: Positive for shortness of breath.    Cardiovascular: Positive for chest pain.  Gastrointestinal: Positive for nausea. Negative for abdominal pain.  Neurological: Positive for dizziness.  All other systems reviewed and are negative.     Allergies  Penicillins  Home Medications   Prior to Admission medications   Medication Sig Start Date End Date Taking? Authorizing Provider  amLODipine (NORVASC) 10 MG tablet Take 1 tablet (10 mg total) by mouth daily. 07/01/13  Yes Shanker Levora DredgeM Ghimire, MD  aspirin 81 MG tablet Take 1 tablet (81 mg total) by mouth daily. 07/01/13  Yes Shanker Levora DredgeM Ghimire, MD  BIOTIN PO Take 1 tablet by mouth daily.   Yes Historical Provider, MD  buPROPion (WELLBUTRIN SR) 150 MG 12 hr tablet Take 150 mg by mouth daily.   Yes Historical Provider, MD  carvedilol (COREG) 6.25 MG tablet Take 1 tablet (6.25 mg total) by mouth 2 (two) times daily with a meal. 07/01/13  Yes Shanker Levora DredgeM Ghimire, MD  hydrochlorothiazide (MICROZIDE) 12.5 MG capsule Take 12.5 mg by mouth daily.   Yes Historical Provider, MD  isosorbide mononitrate (IMDUR) 30 MG 24 hr tablet Take 1 tablet (30 mg total) by mouth daily. 01/20/14  Yes Ambrose FinlandValerie A Keck, NP  losartan (COZAAR) 100 MG tablet Take 1 tablet (100 mg total) by mouth daily. 07/01/13  Yes Shanker Levora DredgeM Ghimire, MD   BP 122/81  Pulse 71  Temp(Src)  98.2 F (36.8 C) (Oral)  Resp 16  SpO2 100%  LMP 01/16/2014 Physical Exam  Nursing note and vitals reviewed. Constitutional: She is oriented to person, place, and time. She appears well-developed and well-nourished. No distress.  HENT:  Head: Normocephalic and atraumatic.  Right Ear: External ear normal.  Left Ear: External ear normal.  Nose: Nose normal.  Eyes: Right eye exhibits no discharge. Left eye exhibits no discharge.  Cardiovascular: Normal rate, regular rhythm and normal heart sounds.   Pulmonary/Chest: Effort normal and breath sounds normal. She exhibits no tenderness.  Abdominal: Soft. There is no tenderness.  Neurological: She  is alert and oriented to person, place, and time.  Skin: Skin is warm and dry.    ED Course  Procedures (including critical care time) Labs Review Labs Reviewed  CBC - Abnormal; Notable for the following:    Hemoglobin 16.3 (*)    HCT 47.4 (*)    All other components within normal limits  BASIC METABOLIC PANEL - Abnormal; Notable for the following:    Potassium 3.3 (*)    BUN 25 (*)    GFR calc non Af Amer 64 (*)    GFR calc Af Amer 74 (*)    All other components within normal limits  I-STAT TROPOININ, ED    Imaging Review Dg Chest Portable 1 View  02/19/2014   CLINICAL DATA:  Chest pain, tachycardia.  EXAM: PORTABLE CHEST - 1 VIEW  COMPARISON:  None.  FINDINGS: The heart size and mediastinal contours are within normal limits. Both lungs are clear. The visualized skeletal structures are unremarkable.  IMPRESSION: No active disease.   Electronically Signed   By: Charlett NoseKevin  Dover M.D.   On: 02/19/2014 22:50     EKG Interpretation   Date/Time:  Friday February 19 2014 21:46:33 EDT Ventricular Rate:  72 PR Interval:  152 QRS Duration: 82 QT Interval:  412 QTC Calculation: 451 R Axis:   70 Text Interpretation:  Normal sinus rhythm Right atrial enlargement  Borderline ECG No old tracing to compare Confirmed by Falen Lehrmann  MD, Tyliah Schlereth  ([id number]) on 02/19/2014 10:01:40 PM      MDM   Final diagnoses:  Chest pain, unspecified chest pain type    Patient feels improved after aspirin and one dose of nitroglycerin. Patient's EKG does not show signs of ischemia and her troponin is normal. However her angina appears to be worsening it is now occurring at rest. Review of Dr. Vern Claudewall's notes show that he is considering catheterization of her symptoms do not improve. As she is sending to the ER with concern for unstable angina, I have consulted cardiology, they will admit.   Audree CamelScott T Tykia Mellone, MD 02/19/14 [phone]

## 2014-02-19 NOTE — ED Notes (Signed)
patietn given sandwich and explained NPO status at midnight.

## 2014-02-19 NOTE — ED Notes (Signed)
Dr. Marijean NiemannYousif at the bedside.

## 2014-02-19 NOTE — ED Notes (Signed)
Dr. Marijean NiemannYousif allows patient to eat if she requests at this time.

## 2014-02-19 NOTE — H&P (Signed)
Cardiology H&pNote  Patient ID: Veronica Turner, MRN: [id number], DOB/AGE: 1960-11-11 53 y.o. Admit date: 02/19/2014   Date of Consult: 02/19/2014 Primary Physician: Holland CommonsKECK, VALERIE, NP Primary Cardiologist:   Chief Complaint: chest pain     Assessment and Plan:  Precordial Chest pain - unstable angina  Hypokalemia - HTN - controlled    Plan  -Admit to tele  -Cont aspirin , coreg , HCTZ . d/c amlodpine and decrease losartan to 50 mg QDaily to liberalize BP for Imdur usage  -Check lipids  -Replete K  -Will trend CE and consider Stress test v/s LHC in am     53 yr old female with hx of HTN , TIA, smoking who is here with chest pain  HPI: pt was seen recently by Dr Valera Castlehomas Wall on 01/27/2014 for her symptoms of chest pain and SOB . These somewhat improved with ISMN 30 mg QD and decreasing her cigarette use. Recently however pt was unable to take her ISMN due to symptoms of orthostasis, lightheadedness and leg heaviness. Subsequently, she started having her chest pain symptoms again which are described as substernal chest pressure ' can't catch my breath', worse with exertion,  with left arm radiation, nausea and diaphoresis that prompted her to come to the ER. Pt denies any orthopnea, PND , LE edema ,focal weakness, syncope, bleeding diathesis , claudication , palpitation etc .  Reports medication compliance  Past Medical History  Diagnosis Date  . Hypertension   . TIA (transient ischemic attack) 04/20/12      Most Recent Cardiac Studies: EKG 02/19/2014 NSR     Surgical History:  Past Surgical History  Procedure Laterality Date  . Cesarean section    . 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 Authorizing Provider  amLODipine (NORVASC) 10 MG tablet Take 1 tablet (10 mg total) by mouth daily. 07/01/13  Yes Shanker Levora DredgeM Ghimire, MD  aspirin 81 MG tablet Take 1 tablet (81 mg total) by mouth  daily. 07/01/13  Yes Shanker Levora DredgeM Ghimire, MD  BIOTIN PO Take 1 tablet by mouth daily.   Yes Historical Provider, MD  buPROPion (WELLBUTRIN SR) 150 MG 12 hr tablet Take 150 mg by mouth daily.   Yes Historical Provider, MD  carvedilol (COREG) 6.25 MG tablet Take 1 tablet (6.25 mg total) by mouth 2 (two) times daily with a meal. 07/01/13  Yes Shanker Levora DredgeM Ghimire, MD  hydrochlorothiazide (MICROZIDE) 12.5 MG capsule Take 12.5 mg by mouth daily.   Yes Historical Provider, MD  isosorbide mononitrate (IMDUR) 30 MG 24 hr tablet Take 1 tablet (30 mg total) by mouth daily. 01/20/14  Yes Ambrose FinlandValerie A Keck, NP  losartan (COZAAR) 100 MG tablet Take 1 tablet (100 mg total) by mouth daily. 07/01/13  Yes Shanker Levora DredgeM Ghimire, MD    Inpatient Medications:       Allergies:  Allergies  Allergen Reactions  . Penicillins     Throat closes     History   Social History  . Marital Status: Married    Spouse Name: N/A    Number of Children: N/A  . Years of Education: N/A   Occupational History  . Not on file.   Social History Main Topics  . Smoking status: Current Every Day Smoker -- 0.50 packs/day    Types: Cigarettes  . Smokeless tobacco: Not on file  . Alcohol Use: 0.5 oz/week  1 drink(s) per week  . Drug Use: No  . Sexual Activity: Not on file   Other Topics Concern  . Not on file   Social History Narrative  . No narrative on file     Family History  Problem Relation Age of Onset  . Heart disease Mother   . Hypertension Mother   . Cancer Father      Review of Systems: General: negative for chills, fever, night sweats or weight changes.  Cardiovascular: per HPI  Dermatological: negative for rash Respiratory: negative for cough or wheezing Urologic: negative for hematuria Abdominal: negative for nausea, vomiting, diarrhea, bright red blood per rectum, melena, or hematemesis Neurologic: negative for visual changes, syncope, or dizziness All other systems reviewed and are otherwise negative  except as noted above.  Labs: No results found for this basename: CKTOTAL, CKMB, TROPONINI,  in the last 72 hours Lab Results  Component Value Date   WBC 5.5 02/19/2014   HGB 16.3* 02/19/2014   HCT 47.4* 02/19/2014   MCV 92.9 02/19/2014   PLT 307 02/19/2014    Recent Labs Lab 02/19/14 2157  NA 142  K 3.3*  CL 98  CO2 29  BUN 25*  CREATININE 1.00  CALCIUM 9.8  GLUCOSE 81   No results found for this basename: CHOL, HDL, LDLCALC, TRIG   No results found for this basename: DDIMER    Radiology/Studies:  Dg Chest Portable 1 View  02/19/2014   CLINICAL DATA:  Chest pain, tachycardia.  EXAM: PORTABLE CHEST - 1 VIEW  COMPARISON:  None.  FINDINGS: The heart size and mediastinal contours are within normal limits. Both lungs are clear. The visualized skeletal structures are unremarkable.  IMPRESSION: No active disease.   Electronically Signed   By: Charlett NoseKevin  Dover M.D.   On: 02/19/2014 22:50     Physical Exam: Blood pressure 125/74, pulse 64, temperature 97.7 F (36.5 C), temperature source Oral, resp. rate 15, last menstrual period 02/12/2014, SpO2 99.00%. General: Well developed, well nourished, in no acute distress.  Neck: Negative for carotid bruits. JVD not elevated. Lungs: distant lung sounds without wheezes, rales, or rhonchi. Breathing is unlabored. Heart: RRR with S1 S2. No murmurs, rubs, or gallops appreciated. Abdomen: Soft, non-tender, non-distended with normoactive bowel sounds. No hepatomegaly. No rebound/guarding. No obvious abdominal masses. Extremities: No clubbing or cyanosis. No edema.  Distal pedal pulses are 2+ and equal bilaterally. Neuro: Alert and oriented X 3. No facial asymmetry. No focal deficit. Moves all extremities spontaneously. Psych:  Responds to questions appropriately with a normal affect.    Lovina ReachSigned, Woodrow Dulski, A M.D  02/19/2014, 11:15 PM

## 2014-02-19 NOTE — ED Notes (Signed)
Substernal cp x 2 hrs. Tightness. "it hurts to breathe." took isosorbide and got some relief.

## 2014-02-19 NOTE — ED Notes (Signed)
Patient reports chest pain 3/10, does not want any more nitro at this time.

## 2014-02-19 NOTE — ED Notes (Signed)
Patient reports her left arm went numb at 9pm tonight.

## 2014-02-20 ENCOUNTER — Observation Stay (HOSPITAL_COMMUNITY): Payer: Self-pay

## 2014-02-20 DIAGNOSIS — F17200 Nicotine dependence, unspecified, uncomplicated: Secondary | ICD-10-CM

## 2014-02-20 DIAGNOSIS — R079 Chest pain, unspecified: Secondary | ICD-10-CM

## 2014-02-20 DIAGNOSIS — E669 Obesity, unspecified: Secondary | ICD-10-CM

## 2014-02-20 LAB — CBC
HEMATOCRIT: 46.5 % — AB (ref 36.0–46.0)
Hemoglobin: 15.6 g/dL — ABNORMAL HIGH (ref 12.0–15.0)
MCH: 31.6 pg (ref 26.0–34.0)
MCHC: 33.5 g/dL (ref 30.0–36.0)
MCV: 94.3 fL (ref 78.0–100.0)
Platelets: 259 10*3/uL (ref 150–400)
RBC: 4.93 MIL/uL (ref 3.87–5.11)
RDW: 12.9 % (ref 11.5–15.5)
WBC: 5.1 10*3/uL (ref 4.0–10.5)

## 2014-02-20 LAB — LIPID PANEL
Cholesterol: 144 mg/dL (ref 0–200)
HDL: 33 mg/dL — AB (ref >39)
LDL Cholesterol: 102 mg/dL — ABNORMAL HIGH (ref 0–99)
TRIGLYCERIDES: 46 mg/dL (ref <150)
Total CHOL/HDL Ratio: 4.4 RATIO
VLDL: 9 mg/dL (ref 0–40)

## 2014-02-20 LAB — BASIC METABOLIC PANEL
ANION GAP: 16 — AB (ref 5–15)
BUN: 21 mg/dL (ref 6–23)
CHLORIDE: 102 meq/L (ref 96–112)
CO2: 23 mEq/L (ref 19–32)
Calcium: 9 mg/dL (ref 8.4–10.5)
Creatinine, Ser: 0.88 mg/dL (ref 0.50–1.10)
GFR calc Af Amer: 86 mL/min — ABNORMAL LOW (ref >90)
GFR calc non Af Amer: 74 mL/min — ABNORMAL LOW (ref >90)
Glucose, Bld: 99 mg/dL (ref 70–99)
POTASSIUM: 3.8 meq/L (ref 3.7–5.3)
Sodium: 141 mEq/L (ref 137–147)

## 2014-02-20 LAB — TROPONIN I
Troponin I: <0.3 ng/mL (ref <0.30)
Troponin I: <0.3 ng/mL (ref <0.30)

## 2014-02-20 LAB — HEMOGLOBIN A1C
Hgb A1c MFr Bld: 5.9 % — ABNORMAL HIGH (ref <5.7)
MEAN PLASMA GLUCOSE: 123 mg/dL — AB (ref <117)

## 2014-02-20 MED ORDER — ACETAMINOPHEN 325 MG PO TABS: 650.0000 mg | ORAL_TABLET | ORAL | Status: DC | PRN

## 2014-02-20 MED ORDER — REGADENOSON 0.4 MG/5ML IV SOLN
0.4000 mg | Freq: Once | INTRAVENOUS | Status: AC
  Administered 2014-02-20: 0.4 mg via INTRAVENOUS
  Filled 2014-02-20: qty 5

## 2014-02-20 MED ORDER — ASPIRIN 81 MG PO CHEW
81.0000 mg | CHEWABLE_TABLET | Freq: Every day | ORAL | Status: DC
  Administered 2014-02-20 – 2014-02-21 (×2): 81 mg via ORAL
  Filled 2014-02-20 (×2): qty 1

## 2014-02-20 MED ORDER — LOSARTAN POTASSIUM 50 MG PO TABS
50.0000 mg | ORAL_TABLET | Freq: Every day | ORAL | Status: DC
  Administered 2014-02-20 – 2014-02-21 (×2): 50 mg via ORAL
  Filled 2014-02-20 (×2): qty 1

## 2014-02-20 MED ORDER — POTASSIUM CHLORIDE CRYS ER 20 MEQ PO TBCR
40.0000 meq | EXTENDED_RELEASE_TABLET | Freq: Once | ORAL | Status: AC
  Administered 2014-02-20: 40 meq via ORAL
  Filled 2014-02-20: qty 2

## 2014-02-20 MED ORDER — HYDROCHLOROTHIAZIDE 12.5 MG PO CAPS
12.5000 mg | ORAL_CAPSULE | Freq: Every day | ORAL | Status: DC
  Administered 2014-02-20 – 2014-02-21 (×2): 12.5 mg via ORAL
  Filled 2014-02-20 (×2): qty 1

## 2014-02-20 MED ORDER — ISOSORBIDE MONONITRATE ER 30 MG PO TB24
30.0000 mg | ORAL_TABLET | Freq: Every day | ORAL | Status: DC
  Administered 2014-02-20 – 2014-02-21 (×2): 30 mg via ORAL
  Filled 2014-02-20 (×2): qty 1

## 2014-02-20 MED ORDER — TECHNETIUM TC 99M SESTAMIBI GENERIC - CARDIOLITE
10.0000 | Freq: Once | INTRAVENOUS | Status: AC | PRN
  Administered 2014-02-20: 10 via INTRAVENOUS

## 2014-02-20 MED ORDER — BUPROPION HCL ER (SR) 150 MG PO TB12
150.0000 mg | ORAL_TABLET | Freq: Every day | ORAL | Status: DC
  Administered 2014-02-20 – 2014-02-21 (×2): 150 mg via ORAL
  Filled 2014-02-20 (×2): qty 1

## 2014-02-20 MED ORDER — REGADENOSON 0.4 MG/5ML IV SOLN
INTRAVENOUS | Status: AC
  Filled 2014-02-20: qty 5

## 2014-02-20 MED ORDER — ONDANSETRON HCL 4 MG/2ML IJ SOLN: 4.0000 mg | Freq: Four times a day (QID) | INTRAMUSCULAR | Status: DC | PRN

## 2014-02-20 MED ORDER — TECHNETIUM TC 99M SESTAMIBI GENERIC - CARDIOLITE
30.0000 | Freq: Once | INTRAVENOUS | Status: AC | PRN
  Administered 2014-02-20: 30 via INTRAVENOUS

## 2014-02-20 MED ORDER — HEPARIN SODIUM (PORCINE) 5000 UNIT/ML IJ SOLN
5000.0000 [IU] | Freq: Three times a day (TID) | INTRAMUSCULAR | Status: DC
  Administered 2014-02-20 (×4): 5000 [IU] via SUBCUTANEOUS
  Filled 2014-02-20 (×8): qty 1

## 2014-02-20 MED ORDER — CARVEDILOL 6.25 MG PO TABS
6.2500 mg | ORAL_TABLET | Freq: Two times a day (BID) | ORAL | Status: DC
  Administered 2014-02-20 – 2014-02-21 (×2): 6.25 mg via ORAL
  Filled 2014-02-20 (×5): qty 1

## 2014-02-20 NOTE — Progress Notes (Signed)
Lexiscan CL performed 

## 2014-02-20 NOTE — Progress Notes (Signed)
    Subjective:  Told me about chest pain and shortness of breath yesterday. Also notes right leg heaviness after standing for a long period of time then getting up from a seated position. No burning, tingling. Excellent distal pulses.   Objective:  Vital Signs in the last 24 hours: Temp:  [97.7 F (36.5 C)-98.2 F (36.8 C)] 97.9 F (36.6 C) (07/04 0500) Pulse Rate:  [63-81] 66 (07/04 0500) Resp:  [15-22] 18 (07/04 0500) BP: (103-152)/(63-94) 129/77 mmHg (07/04 0500) SpO2:  [97 %-100 %] 97 % (07/04 0500) Weight:  [200 lb (90.719 kg)] 200 lb (90.719 kg) (07/04 0500)  Intake/Output from previous day: 07/03 0701 - 07/04 0700 In: 1000 [I.V.:1000] Out: -    Physical Exam: General: Well developed, well nourished, in no acute distress. Head:  Normocephalic and atraumatic. Lungs: Clear to auscultation and percussion. Heart: Normal S1 and S2.  No murmur, rubs or gallops.  Abdomen: soft, non-tender, positive bowel sounds. Extremities: No clubbing or cyanosis. No edema. Normal DP/PT pulses RLE.  Neurologic: Alert and oriented x 3.    Lab Results:  Recent Labs  02/19/14 2157 02/20/14 0123  WBC 5.5 5.1  HGB 16.3* 15.6*  PLT 307 259    Recent Labs  02/19/14 2157 02/20/14 0306  NA 142 141  K 3.3* 3.8  CL 98 102  CO2 29 23  GLUCOSE 81 99  BUN 25* 21  CREATININE 1.00 0.88    Recent Labs  02/20/14 0306 02/20/14 0648  TROPONINI <0.30 <0.30     Imaging: Dg Chest Portable 1 View  02/19/2014   CLINICAL DATA:  Chest pain, tachycardia.  EXAM: PORTABLE CHEST - 1 VIEW  COMPARISON:  None.  FINDINGS: The heart size and mediastinal contours are within normal limits. Both lungs are clear. The visualized skeletal structures are unremarkable.  IMPRESSION: No active disease.   Electronically Signed   By: Charlett NoseKevin  Dover M.D.   On: 02/19/2014 22:50   Personally viewed.   Telemetry: Unremarkable. Personally viewed.   EKG:  SR no ST changes  Cardiac Studies:  NUC pending  .  aspirin  81 mg Oral Daily  . buPROPion  150 mg Oral Daily  . carvedilol  6.25 mg Oral BID WC  . heparin  5,000 Units Subcutaneous 3 times per day  . hydrochlorothiazide  12.5 mg Oral Daily  . isosorbide mononitrate  30 mg Oral Daily  . losartan  50 mg Oral Daily   Assessment/Plan:  Active Problems:   Precordial chest pain  1) Chest pain  - NUC stress - she is a smoker, obese, HTN, HL.   - Seen by Dr. Daleen SquibbWall - Imdur used.   - Agree with plan by admitting MD.   2) Obesity  - weight loss   3) Tobacco cessation  4) Right leg heaviness  - pulses distally are excellent. Doubt PAD.   - could be MSK or neurologic   - she has noted this after recently taking a job where she is standing for a long period of time.   - encouraged stretching.   - could have further eval out outpatient if needed.   IF NUC low risk, OK for DC   SKAINS, MARK 02/20/2014, 10:05 AM

## 2014-02-20 NOTE — ED Notes (Signed)
Called pharmacy to verify potassium.

## 2014-02-21 ENCOUNTER — Encounter (HOSPITAL_COMMUNITY): Payer: Self-pay | Admitting: Physician Assistant

## 2014-02-21 DIAGNOSIS — I10 Essential (primary) hypertension: Secondary | ICD-10-CM

## 2014-02-21 MED ORDER — POTASSIUM CHLORIDE CRYS ER 20 MEQ PO TBCR
20.0000 meq | EXTENDED_RELEASE_TABLET | Freq: Once | ORAL | Status: AC
  Administered 2014-02-21: 20 meq via ORAL
  Filled 2014-02-21: qty 1

## 2014-02-21 MED ORDER — LOSARTAN POTASSIUM 100 MG PO TABS: 50.0000 mg | ORAL_TABLET | Freq: Every day | ORAL | Status: DC

## 2014-02-21 MED ORDER — POTASSIUM CHLORIDE ER 20 MEQ PO TBCR: EXTENDED_RELEASE_TABLET | ORAL | Status: DC

## 2014-02-21 NOTE — Discharge Summary (Signed)
Personally seen and examined. Agree with above. Chest discomfort likely musculoskeletal currently, changing with position, stretching. Nuclear stress test reassuring with no ischemia. Continuing for now isosorbide to cover the possibility of coronary etiology. She has close followup with Dr. Daleen SquibbWall.  Donato SchultzSKAINS, Elliona Doddridge, MD

## 2014-02-21 NOTE — Discharge Summary (Signed)
Discharge Summary   Patient ID: Veronica Turner MRN: [id number], DOB/AGE: [birth year] 53 y.o. Admit date: 02/19/2014 D/C date:     02/21/2014  Primary Care Provider: Holland CommonsKECK, VALERIE, NP Primary Cardiologist[facility]  Primary Discharge Diagnoses:  1. Atypical chest pain, suspect musculoskeletal 2. Tobacco abuse 3. HTN 4. Obesity Body mass index is 34.83 kg/(m^2).  Secondary Discharge Diagnoses:  1. H/o TIA 04/2012  Hospital Course: Ms. Veronica RosenthalJackson is a 53 y/o F with history of TIA and HTN who [facility] of chest discomfort. She was recently seen by [facility]Wall [facility] with chest pain and SOB. These somewhat improved with Imdur 30 mg QD and decreasing her cigarette use. However, she was recently unable to take her Imdur due to symptoms of orthostasis, lightheadedness and leg heaviness. She came to the hospital reporting chest pain/pressure with left arm radiation, nausea and diaphoresis. She denied any orthopnea, PND, LEE, focal weakness, syncope or palpitations. EKG showed NSR and CXR was nonacute. Vitals were stable. She was admitted to the hospital for further evaluation and ruled out for MI. She underwent Lexiscan nuclear stress testing which showed "No findings for infarction or ischemia. Ejection fraction calculated at 57%." Tobacco cessation was recommended. In retrospect the patient did admit that sometimes when she is reaching above her head or twisting her torso sometimes this does duplicate this chest discomfort and she feels sometimes a "knot" under her breast region likely related to muscular tension. Her chest pain is felt musculoskeletal at this time. However, given relief with Imdur will continue this medicine. Her amlodipine was stopped and Losartan has been decreased to allow more BP room to use Imdur without adverse hypotension. If chest discomfort becomes more worrisome, further testing with cardiac  catheterization can take place in the future if absolutely necessary. She was also started on KCl 20meq daily given hypokalemia likely r/t HCTZ. She has followup 02/24/14 with Dr. Daleen SquibbWall - we will keep this appointment. Dr. Anne FuSkains has seen and examined the patient today and feels she is stable for discharge.  Discharge Vitals: Blood pressure 113/67, pulse 72, temperature 98.2 F (36.8 C), temperature source Oral, resp. rate 18, height 5\' 4"  (1.626 m), weight 203 lb (92.08 kg), last menstrual period 02/12/2014, SpO2 97.00%.  Labs: Lab Results  Component Value Date   WBC 5.1 02/20/2014   HGB 15.6* 02/20/2014   HCT 46.5* 02/20/2014   MCV 94.3 02/20/2014   PLT 259 02/20/2014     Recent Labs Lab 02/20/14 0306  NA 141  K 3.8  CL 102  CO2 23  BUN 21  CREATININE 0.88  CALCIUM 9.0  GLUCOSE 99    Recent Labs  02/20/14 0306 02/20/14 0648 02/20/14 1525  TROPONINI <0.30 <0.30 <0.30   Lab Results  Component Value Date   CHOL 144 02/20/2014   HDL 33* 02/20/2014   LDLCALC 102* 02/20/2014   TRIG 46 02/20/2014     Diagnostic Studies/Procedures   Nm Myocar Multi W/spect W/wall Motion / Ef  02/20/2014   CLINICAL DATA:  Chest pain.  EXAM: MYOCARDIAL IMAGING WITH SPECT (REST AND PHARMACOLOGIC-STRESS)  GATED LEFT VENTRICULAR WALL MOTION STUDY  LEFT VENTRICULAR EJECTION FRACTION  TECHNIQUE: Standard myocardial SPECT imaging was performed after resting intravenous injection of 10 mCi Tc-7536m sestamibi. Subsequently, intravenous infusion of Lexiscan was performed under the supervision of the Cardiology staff. At peak effect of the drug, 30 mCi Tc-3536m sestamibi was injected intravenously and  standard myocardial SPECT imaging was performed. Quantitative gated imaging was also performed to evaluate left ventricular wall motion, and estimate left ventricular ejection fraction.  COMPARISON:  None.  FINDINGS: The SPECT stress and rest images do not demonstrate any fixed or reversible defects to suggest infarction or  ischemia. The gated SPECT images demonstrate normal wall motion myocardial thickening with contraction. Estimated ejection fraction was 57%.  IMPRESSION: No findings for infarction or ischemia.  Ejection fraction calculated at 57%.   Electronically Signed   By: Loralie ChampagneMark  Gallerani M.D.   On: 02/20/2014 16:52   Dg Chest Portable 1 View  02/19/2014   CLINICAL DATA:  Chest pain, tachycardia.  EXAM: PORTABLE CHEST - 1 VIEW  COMPARISON:  None.  FINDINGS: The heart size and mediastinal contours are within normal limits. Both lungs are clear. The visualized skeletal structures are unremarkable.  IMPRESSION: No active disease.   Electronically Signed   By: Charlett NoseKevin  Dover M.D.   On: 02/19/2014 22:50    Discharge Medications   Current Discharge Medication List    START taking these medications   Details  potassium chloride 20 MEQ TBCR Take 1 tablet (20 meq) by mouth daily. Qty: 30 tablet, Refills: 3      CONTINUE these medications which have CHANGED   Details  losartan (COZAAR) 100 MG tablet Take 0.5 tablets (50 mg total) by mouth daily. Qty: 30 tablet, Refills: 3      CONTINUE these medications which have NOT CHANGED   Details  aspirin 81 MG tablet Take 1 tablet (81 mg total) by mouth daily.     BIOTIN PO Take 1 tablet by mouth daily.    buPROPion (WELLBUTRIN SR) 150 MG 12 hr tablet Take 150 mg by mouth daily.    carvedilol (COREG) 6.25 MG tablet Take 1 tablet (6.25 mg total) by mouth 2 (two) times daily with a meal. Qty: 60 tablet, Refills: 3    hydrochlorothiazide (MICROZIDE) 12.5 MG capsule Take 12.5 mg by mouth daily.    isosorbide mononitrate (IMDUR) 30 MG 24 hr tablet Take 1 tablet (30 mg total) by mouth daily.    Associated Diagnoses: HTN (hypertension)      STOP taking these medications     amLODipine (NORVASC) 10 MG tablet         Disposition   The patient will be discharged in stable condition to home. Discharge Instructions   Diet - low sodium heart healthy    Complete  by:  As directed      Increase activity slowly    Complete by:  As directed   We have stopped your amlodipine for now and decreased your losartan dose to allow your blood pressure to come up a little bit so that you can hopefully use the Imdur without side effects. (These side effects were likely due to too low of blood pressure.)          Follow-up Information   Follow up with Valera Castlehomas Wall, MD. (02/24/14 at 9am)    Specialty:  Cardiology   Contact information:   [address] [place] [address] [phone]         Duration of Discharge Encounter: Greater than 30 minutes including physician and PA time.  Signed, Ronie Spiesayna Arlyne Brandes PA-C 02/21/2014, 8:49 AM

## 2014-02-21 NOTE — Progress Notes (Signed)
Subjective:  No chest pain, currently feeling well. Eager to go home.  Objective:  Vital Signs in the last 24 hours: Temp:  [97.3 F (36.3 C)-98.2 F (36.8 C)] 98.2 F (36.8 C) (07/05 0500) Pulse Rate:  [52-72] 72 (07/05 0500) Resp:  [18] 18 (07/05 0500) BP: (113-151)/(56-89) 113/67 mmHg (07/05 0500) SpO2:  [97 %-100 %] 97 % (07/05 0500) Weight:  [203 lb (92.08 kg)] 203 lb (92.08 kg) (07/05 0500)  Intake/Output from previous day: 07/04 0701 - 07/05 0700 In: 360 [P.O.:360] Out: -    Physical Exam: General: Well developed, well nourished, in no acute distress. Head:  Normocephalic and atraumatic. Lungs: Clear to auscultation and percussion. Heart: Normal S1 and S2.  No murmur, rubs or gallops.  Abdomen: soft, non-tender, positive bowel sounds. Overweight Extremities: No clubbing or cyanosis. No edema. Neurologic: Alert and oriented x 3.    Lab Results:  Recent Labs  02/19/14 2157 02/20/14 0123  WBC 5.5 5.1  HGB 16.3* 15.6*  PLT 307 259    Recent Labs  02/19/14 2157 02/20/14 0306  NA 142 141  K 3.3* 3.8  CL 98 102  CO2 29 23  GLUCOSE 81 99  BUN 25* 21  CREATININE 1.00 0.88    Recent Labs  02/20/14 0648 02/20/14 1525  TROPONINI <0.30 <0.30      Imaging: Nm Myocar Multi W/spect W/wall Motion / Ef  02/20/2014   CLINICAL DATA:  Chest pain.  EXAM: MYOCARDIAL IMAGING WITH SPECT (REST AND PHARMACOLOGIC-STRESS)  GATED LEFT VENTRICULAR WALL MOTION STUDY  LEFT VENTRICULAR EJECTION FRACTION  TECHNIQUE: Standard myocardial SPECT imaging was performed after resting intravenous injection of 10 mCi Tc-471m sestamibi. Subsequently, intravenous infusion of Lexiscan was performed under the supervision of the Cardiology staff. At peak effect of the drug, 30 mCi Tc-6671m sestamibi was injected intravenously and standard myocardial SPECT imaging was performed. Quantitative gated imaging was also performed to evaluate left ventricular wall motion, and estimate left  ventricular ejection fraction.  COMPARISON:  None.  FINDINGS: The SPECT stress and rest images do not demonstrate any fixed or reversible defects to suggest infarction or ischemia. The gated SPECT images demonstrate normal wall motion myocardial thickening with contraction. Estimated ejection fraction was 57%.  IMPRESSION: No findings for infarction or ischemia.  Ejection fraction calculated at 57%.   Electronically Signed   By: Loralie ChampagneMark  Gallerani M.D.   On: 02/20/2014 16:52   Dg Chest Portable 1 View  02/19/2014   CLINICAL DATA:  Chest pain, tachycardia.  EXAM: PORTABLE CHEST - 1 VIEW  COMPARISON:  None.  FINDINGS: The heart size and mediastinal contours are within normal limits. Both lungs are clear. The visualized skeletal structures are unremarkable.  IMPRESSION: No active disease.   Electronically Signed   By: Charlett NoseKevin  Dover M.D.   On: 02/19/2014 22:50   Personally viewed.   Telemetry: No adverse arrhythmias Personally viewed.   EKG:  No ST segment changes  Cardiac Studies:  02/20/14-nuclear stress test-low risk, no ischemia  Assessment/Plan:  Active Problems:   Precordial chest pain  1. Atypical chest pain-likely musculoskeletal. She does admit this morning when she is reaching above her head or twisting her torso sometimes this does reduplicate chest discomfort and she feels sometimes a "knot "under her breast region likely muscular tension. Gave her reassurance based upon nuclear stress test. She has been taking Imdur  30 mg a day. This previously seemed to help her chest discomfort significantly. We will continue.  If chest discomfort  becomes more worrisome, further testing with cardiac catheterization can take place in the future if absolutely necessary. Reassuring nuclear stress test with no ischemia and during this admission. Troponins were normal.  2. Tobacco use-tobacco cessation counseling. This is of utmost importance for her.  3. Hypertension-under good control. Cozaar, carvedilol  continue at current hospital dosing. She is also on HCTZ low-dose.  She has followup on 02/24/14 in a few days with Dr. Juanito Doomom Wall. Let's continue with this appointment.   Veronica Turner 02/21/2014, 8:14 AM

## 2014-02-24 ENCOUNTER — Encounter: Payer: Self-pay | Admitting: Cardiology

## 2014-02-24 ENCOUNTER — Ambulatory Visit: Payer: Self-pay | Attending: Cardiology | Admitting: Cardiology

## 2014-02-24 ENCOUNTER — Ambulatory Visit: Payer: Self-pay | Admitting: Cardiology

## 2014-02-24 VITALS — BP 135/85 | HR 73 | Temp 99.0°F | Resp 20 | Ht 63.0 in | Wt 197.0 lb

## 2014-02-24 DIAGNOSIS — F17200 Nicotine dependence, unspecified, uncomplicated: Secondary | ICD-10-CM | POA: Insufficient documentation

## 2014-02-24 DIAGNOSIS — R072 Precordial pain: Secondary | ICD-10-CM

## 2014-02-24 DIAGNOSIS — E669 Obesity, unspecified: Secondary | ICD-10-CM

## 2014-02-24 DIAGNOSIS — R5381 Other malaise: Secondary | ICD-10-CM

## 2014-02-24 DIAGNOSIS — R079 Chest pain, unspecified: Secondary | ICD-10-CM | POA: Insufficient documentation

## 2014-02-24 DIAGNOSIS — R5383 Other fatigue: Secondary | ICD-10-CM | POA: Insufficient documentation

## 2014-02-24 DIAGNOSIS — I10 Essential (primary) hypertension: Secondary | ICD-10-CM

## 2014-02-24 NOTE — Patient Instructions (Addendum)
Please continue taking blood pressure daily and continue taking medications as prescribed. Good work on smoking cessation. Keep at it! You can do it!  It was great seeing you again. Take care!

## 2014-02-24 NOTE — Progress Notes (Signed)
Ms Veronica Turner returns today for close followup after being discharged from the hospital with chest pain. She was seen by Dr. Anne FuSkains of cardiology who recommended a stress nuclear study with her multiple cardiac risk factors. Nuclear stress study showed an ejection fraction of 57% with no infarction or ischemia.  I saw her about a month ago and thought she may have exertional angina. Asked to continue the isosorbide which was helping her chest discomfort, continue amlodipine and carvedilol. Asked her stop smoking. Schedule her for followup to see if she continued to improve or whether she might need a diagnostic catheterization. She presented to the hospital prior to today's visit as noted above. Discharge summary reviewed in detail.  Other findings are a normal EKG, negative cardiac markers x3, and a chest x-ray without cardiomegaly or cardiopulmonary disease.  She's cut back on her smoking and is using Wellbutrin as an aid.  Physical examination today is unchanged.

## 2014-02-24 NOTE — Assessment & Plan Note (Signed)
Stress Myoview shows no obstructive coronary artery disease. We talked a long time today about coronary blood flow or spasm have asked to continue her isosorbide. She will try to quit smoking of the next couple months. I'll see her back for a count ability in 3 months.

## 2014-02-24 NOTE — Assessment & Plan Note (Signed)
We'll check TSH, B12 and folate with her MCV being slightly elevated. I suspect they'll be normal.

## 2014-02-24 NOTE — Progress Notes (Signed)
Patient here for follow-up of chest pain.  Recently hospitalized 02/19/14-02/21/14. Patient denies chest pain, swelling since discharge.  Indicates she has shortness of breath with quick movements and sometimes has headaches in back of her head. Patient indicates she takes her BP daily and has been exercising 3x/week. Patient still smoking but indicates she has cut down considerably with the help of Wellbutrin. Encouraged patient to continue working on smoking cessation.

## 2014-02-25 LAB — VITAMIN B12: VITAMIN B 12: 478 pg/mL (ref 211–911)

## 2014-02-25 LAB — FOLATE: Folate: >20 ng/mL

## 2014-02-25 LAB — TSH: TSH: 1.557 u[IU]/mL (ref 0.350–4.500)

## 2014-03-10 ENCOUNTER — Telehealth: Payer: Self-pay

## 2014-03-10 NOTE — Telephone Encounter (Signed)
Placed call to patient to inform of lab results.  Left voicemail requesting return call.

## 2014-03-11 ENCOUNTER — Telehealth: Payer: Self-pay | Admitting: Emergency Medicine

## 2014-03-11 ENCOUNTER — Telehealth: Payer: Self-pay | Admitting: Internal Medicine

## 2014-03-11 NOTE — Telephone Encounter (Signed)
Pt. Returned call...please call patient

## 2014-03-11 NOTE — Telephone Encounter (Signed)
Pt given normal lab results 

## 2014-05-24 ENCOUNTER — Other Ambulatory Visit: Payer: Self-pay | Admitting: Internal Medicine

## 2014-06-21 ENCOUNTER — Encounter: Payer: Self-pay | Admitting: Cardiology

## 2014-06-23 ENCOUNTER — Ambulatory Visit: Payer: Self-pay | Admitting: Internal Medicine

## 2014-06-29 ENCOUNTER — Other Ambulatory Visit: Payer: Self-pay

## 2014-06-29 DIAGNOSIS — Z1231 Encounter for screening mammogram for malignant neoplasm of breast: Secondary | ICD-10-CM

## 2014-07-01 ENCOUNTER — Ambulatory Visit: Payer: Self-pay | Attending: Internal Medicine | Admitting: Internal Medicine

## 2014-07-01 ENCOUNTER — Encounter: Payer: Self-pay | Admitting: Internal Medicine

## 2014-07-01 VITALS — BP 158/106 | HR 71 | Temp 97.9°F | Resp 16 | Ht 64.0 in | Wt 191.0 lb

## 2014-07-01 DIAGNOSIS — Z2821 Immunization not carried out because of patient refusal: Secondary | ICD-10-CM

## 2014-07-01 DIAGNOSIS — F17210 Nicotine dependence, cigarettes, uncomplicated: Secondary | ICD-10-CM | POA: Insufficient documentation

## 2014-07-01 DIAGNOSIS — F17200 Nicotine dependence, unspecified, uncomplicated: Secondary | ICD-10-CM

## 2014-07-01 DIAGNOSIS — Z720 Tobacco use: Secondary | ICD-10-CM

## 2014-07-01 DIAGNOSIS — Z7982 Long term (current) use of aspirin: Secondary | ICD-10-CM | POA: Insufficient documentation

## 2014-07-01 DIAGNOSIS — R21 Rash and other nonspecific skin eruption: Secondary | ICD-10-CM | POA: Insufficient documentation

## 2014-07-01 DIAGNOSIS — I10 Essential (primary) hypertension: Secondary | ICD-10-CM | POA: Insufficient documentation

## 2014-07-01 DIAGNOSIS — Z8673 Personal history of transient ischemic attack (TIA), and cerebral infarction without residual deficits: Secondary | ICD-10-CM | POA: Insufficient documentation

## 2014-07-01 DIAGNOSIS — R072 Precordial pain: Secondary | ICD-10-CM | POA: Insufficient documentation

## 2014-07-01 MED ORDER — CARVEDILOL 6.25 MG PO TABS: 6.2500 mg | ORAL_TABLET | Freq: Two times a day (BID) | ORAL | Status: DC

## 2014-07-01 MED ORDER — ISOSORBIDE MONONITRATE ER 30 MG PO TB24: ORAL_TABLET | ORAL | Status: DC

## 2014-07-01 MED ORDER — ASPIRIN 81 MG PO TABS: 81.0000 mg | ORAL_TABLET | Freq: Every day | ORAL | Status: AC

## 2014-07-01 MED ORDER — HYDROCHLOROTHIAZIDE 25 MG PO TABS: 25.0000 mg | ORAL_TABLET | Freq: Every day | ORAL | Status: DC

## 2014-07-01 MED ORDER — LOSARTAN POTASSIUM 50 MG PO TABS: 50.0000 mg | ORAL_TABLET | Freq: Every day | ORAL | Status: DC

## 2014-07-01 MED ORDER — BUPROPION HCL ER (SR) 150 MG PO TB12: 150.0000 mg | ORAL_TABLET | Freq: Every day | ORAL | Status: DC

## 2014-07-01 MED ORDER — TRIAMCINOLONE ACETONIDE 0.1 % EX CREA: 1.0000 "application " | TOPICAL_CREAM | Freq: Two times a day (BID) | CUTANEOUS | Status: DC

## 2014-07-01 NOTE — Progress Notes (Signed)
Patient ID: Veronica Turner, female   DOB: 1960/10/08, 53 y.o.   MRN: [id number]  CC: HTN follow up  HPI:  Patient presents to clinic today for a hospital follow up. Patient reports that she has not taken any of her blood pressure medication this morning due to rushing for her appt. She reports that she has been taking her medications as prescribed. She is concerned about chest pain. Reports that chest pain has greatly improved with Imdur but she does notice pain when she forgets to take the Imdur.  Has rash on BLE that has been present for one month that is scaly and itching.  She reports that the rash has not spread and is limited to her legs.  The rash began as red areas and later turns into dark scaly patches.    Allergies  Allergen Reactions  . Penicillins     Throat closes    Past Medical History  Diagnosis Date  . Hypertension   . TIA (transient ischemic attack) 04/20/12  . Chest pain     a. Normal stress test 02/2014.  . Tobacco abuse   . Obesity    Current Outpatient Prescriptions on File Prior to Visit  Medication Sig Dispense Refill  . aspirin 81 MG tablet Take 1 tablet (81 mg total) by mouth daily. 30 tablet 3  . BIOTIN PO Take 1 tablet by mouth daily.    Marland Kitchen. buPROPion (WELLBUTRIN SR) 150 MG 12 hr tablet Take 150 mg by mouth daily.    . carvedilol (COREG) 6.25 MG tablet Take 1 tablet (6.25 mg total) by mouth 2 (two) times daily with a meal. 60 tablet 3  . hydrochlorothiazide (MICROZIDE) 12.5 MG capsule Take 12.5 mg by mouth daily.    . isosorbide mononitrate (IMDUR) 30 MG 24 hr tablet TAKE 1 TABLET BY MOUTH DAILY. 30 tablet 2  . losartan (COZAAR) 100 MG tablet Take 0.5 tablets (50 mg total) by mouth daily. 30 tablet 3  . potassium chloride 20 MEQ TBCR Take 1 tablet (20 meq) by mouth daily. 30 tablet 3   No current facility-administered medications on file prior to visit.   Family History  Problem Relation Age of Onset  . Heart disease Mother   . Hypertension Mother   .  Cancer Father    History   Social History  . Marital Status: Married    Spouse Name: N/A    Number of Children: N/A  . Years of Education: N/A   Occupational History  . Not on file.   Social History Main Topics  . Smoking status: Current Every Day Smoker -- 0.25 packs/day    Types: Cigarettes  . Smokeless tobacco: Not on file  . Alcohol Use: 0.5 oz/week    1 drink(s) per week  . Drug Use: No  . Sexual Activity: Not on file   Other Topics Concern  . Not on file   Social History Narrative    Review of Systems  Eyes: Negative.   Respiratory: Negative.   Cardiovascular: Positive for chest pain. Negative for palpitations and leg swelling.       Varicose veins  Genitourinary: Negative.   Skin: Positive for itching and rash.  Neurological: Positive for headaches. Negative for dizziness.      Objective:   Filed Vitals:   07/01/14 0925  BP: 158/106  Pulse: 71  Temp: 97.9 F (36.6 C)  Resp: 16    Physical Exam  Constitutional: She is oriented to person, place, and time.  Cardiovascular: Normal rate, regular rhythm, normal heart sounds and intact distal pulses.   No murmur heard. Pulmonary/Chest: Effort normal and breath sounds normal.  Abdominal: Soft. Bowel sounds are normal.  Neurological: She is alert and oriented to person, place, and time. She has normal reflexes.  Skin: Rash (hyperpigmented scaly patches) noted.  Has varicose veins on BLE     Lab Results  Component Value Date   WBC 5.1 02/20/2014   HGB 15.6* 02/20/2014   HCT 46.5* 02/20/2014   MCV 94.3 02/20/2014   PLT 259 02/20/2014   Lab Results  Component Value Date   CREATININE 0.88 02/20/2014   BUN 21 02/20/2014   NA 141 02/20/2014   K 3.8 02/20/2014   CL 102 02/20/2014   CO2 23 02/20/2014    Lab Results  Component Value Date   HGBA1C 5.9* 02/20/2014   Lipid Panel     Component Value Date/Time   CHOL 144 02/20/2014 0306   TRIG 46 02/20/2014 0306   HDL 33* 02/20/2014 0306    CHOLHDL 4.4 02/20/2014 0306   VLDL 9 02/20/2014 0306   LDLCALC 102* 02/20/2014 0306       Assessment and plan:   Drinda Buttsnnette was seen today for follow-up.  Diagnoses and associated orders for this visit:  Essential hypertension - aspirin 81 MG tablet; Take 1 tablet (81 mg total) by mouth daily. - carvedilol (COREG) 6.25 MG tablet; Take 1 tablet (6.25 mg total) by mouth 2 (two) times daily with a meal. - losartan (COZAAR) 50 MG tablet; Take 1 tablet (50 mg total) by mouth daily. - hydrochlorothiazide (HYDRODIURIL) 25 MG tablet; Take 1 tablet (25 mg total) by mouth daily.  Explained to patient diet modifications that may contribute to elevated BP. Patient will avoid foods that are high in sodium such as  canned soups and vegetables, tomato juice, commercial baked goods, commercially prepared frozen or canned entrees and sauces. Avoid salty snacks, added salt when cooking, and substituting for low sodium herbs or spices Explained exercise regimen of cardio at least three times weekly to help lower BP and cholesterol  Precordial chest pain - isosorbide mononitrate (IMDUR) 30 MG 24 hr tablet; TAKE 1 TABLET BY MOUTH DAILY.  Recommended patient to continue Imdur and explained the nature of coronary artery spasms. Advised smoking cessation relating to spasms  Rash and nonspecific skin eruption Likely Pityriasis Rosea, will give Kenalog cream  Smoker - Continue buPROPion (WELLBUTRIN SR) 150 MG 12 hr tablet; Take 1 tablet (150 mg total) by mouth daily. Smoking cessation instruction/counseling given:  counseled patient on the dangers of tobacco use, advised patient to stop smoking, and reviewed strategies to maximize success Commended her for cutting back  Refused influenza vaccine Influenza injection received.  Explained side effects and contraindications to patient. Information sheet given to patient.   Return in about 3 months (around 10/01/2014) for Hypertension.       Holland CommonsKECK, VALERIE,  NP-C [facility] [phone] 07/01/2014, 9:37 AM

## 2014-07-01 NOTE — Progress Notes (Signed)
Pt is here following up on her HTN. Pt states that she is not feeling well like she feels " drained, loopy, sluggish and tired". Pt reports that her legs get stiff and her lower back has been aching for 2 weeks.

## 2014-07-01 NOTE — Patient Instructions (Signed)
Smoking Cessation Quitting smoking is important to your health and has many advantages. However, it is not always easy to quit since nicotine is a very addictive drug. Oftentimes, people try 3 times or more before being able to quit. This document explains the best ways for you to prepare to quit smoking. Quitting takes hard work and a lot of effort, but you can do it. ADVANTAGES OF QUITTING SMOKING  You will live longer, feel better, and live better.  Your body will feel the impact of quitting smoking almost immediately.  Within 20 minutes, blood pressure decreases. Your pulse returns to its normal level.  After 8 hours, carbon monoxide levels in the blood return to normal. Your oxygen level increases.  After 24 hours, the chance of having a heart attack starts to decrease. Your breath, hair, and body stop smelling like smoke.  After 48 hours, damaged nerve endings begin to recover. Your sense of taste and smell improve.  After 72 hours, the body is virtually free of nicotine. Your bronchial tubes relax and breathing becomes easier.  After 2 to 12 weeks, lungs can hold more air. Exercise becomes easier and circulation improves.  The risk of having a heart attack, stroke, cancer, or lung disease is greatly reduced.  After 1 year, the risk of coronary heart disease is cut in half.  After 5 years, the risk of stroke falls to the same as a nonsmoker.  After 10 years, the risk of lung cancer is cut in half and the risk of other cancers decreases significantly.  After 15 years, the risk of coronary heart disease drops, usually to the level of a nonsmoker.  If you are pregnant, quitting smoking will improve your chances of having a healthy baby.  The people you live with, especially any children, will be healthier.  You will have extra money to spend on things other than cigarettes. QUESTIONS TO THINK ABOUT BEFORE ATTEMPTING TO QUIT You may want to talk about your answers with your  health care provider.  Why do you want to quit?  If you tried to quit in the past, what helped and what did not?  What will be the most difficult situations for you after you quit? How will you plan to handle them?  Who can help you through the tough times? Your family? Friends? A health care provider?  What pleasures do you get from smoking? What ways can you still get pleasure if you quit? Here are some questions to ask your health care provider:  How can you help me to be successful at quitting?  What medicine do you think would be best for me and how should I take it?  What should I do if I need more help?  What is smoking withdrawal like? How can I get information on withdrawal? GET READY  Set a quit date.  Change your environment by getting rid of all cigarettes, ashtrays, matches, and lighters in your home, car, or work. Do not let people smoke in your home.  Review your past attempts to quit. Think about what worked and what did not. GET SUPPORT AND ENCOURAGEMENT You have a better chance of being successful if you have help. You can get support in many ways.  Tell your family, friends, and coworkers that you are going to quit and need their support. Ask them not to smoke around you.  Get individual, group, or telephone counseling and support. Programs are available at local hospitals and health centers. Call   your local health department for information about programs in your area.  Spiritual beliefs and practices may help some smokers quit.  Download a "quit meter" on your computer to keep track of quit statistics, such as how long you have gone without smoking, cigarettes not smoked, and money saved.  Get a self-help book about quitting smoking and staying off tobacco. LEARN NEW SKILLS AND BEHAVIORS  Distract yourself from urges to smoke. Talk to someone, go for a walk, or occupy your time with a task.  Change your normal routine. Take a different route to work.  Drink tea instead of coffee. Eat breakfast in a different place.  Reduce your stress. Take a hot bath, exercise, or read a book.  Plan something enjoyable to do every day. Reward yourself for not smoking.  Explore interactive web-based programs that specialize in helping you quit. GET MEDICINE AND USE IT CORRECTLY Medicines can help you stop smoking and decrease the urge to smoke. Combining medicine with the above behavioral methods and support can greatly increase your chances of successfully quitting smoking.  Nicotine replacement therapy helps deliver nicotine to your body without the negative effects and risks of smoking. Nicotine replacement therapy includes nicotine gum, lozenges, inhalers, nasal sprays, and skin patches. Some may be available over-the-counter and others require a prescription.  Antidepressant medicine helps people abstain from smoking, but how this works is unknown. This medicine is available by prescription.  Nicotinic receptor partial agonist medicine simulates the effect of nicotine in your brain. This medicine is available by prescription. Ask your health care provider for advice about which medicines to use and how to use them based on your health history. Your health care provider will tell you what side effects to look out for if you choose to be on a medicine or therapy. Carefully read the information on the package. Do not use any other product containing nicotine while using a nicotine replacement product.  RELAPSE OR DIFFICULT SITUATIONS Most relapses occur within the first 3 months after quitting. Do not be discouraged if you start smoking again. Remember, most people try several times before finally quitting. You may have symptoms of withdrawal because your body is used to nicotine. You may crave cigarettes, be irritable, feel very hungry, cough often, get headaches, or have difficulty concentrating. The withdrawal symptoms are only temporary. They are strongest  when you first quit, but they will go away within 10-14 days. To reduce the chances of relapse, try to:  Avoid drinking alcohol. Drinking lowers your chances of successfully quitting.  Reduce the amount of caffeine you consume. Once you quit smoking, the amount of caffeine in your body increases and can give you symptoms, such as a rapid heartbeat, sweating, and anxiety.  Avoid smokers because they can make you want to smoke.  Do not let weight gain distract you. Many smokers will gain weight when they quit, usually less than 10 pounds. Eat a healthy diet and stay active. You can always lose the weight gained after you quit.  Find ways to improve your mood other than smoking. FOR MORE INFORMATION  www.smokefree.gov  Document Released: 07/31/2001 Document Revised: 12/21/2013 Document Reviewed: 11/15/2011 [employer] Patient Information 2015 [employer]. This information is not intended to replace advice given to you by your health care provider. Make sure you discuss any questions you have with your health care provider.  

## 2014-07-02 ENCOUNTER — Telehealth: Payer: Self-pay | Admitting: Emergency Medicine

## 2014-07-02 NOTE — Telephone Encounter (Signed)
Left message on VM that pt may pick Kenolog script at [facility]

## 2014-07-27 ENCOUNTER — Ambulatory Visit: Payer: Self-pay

## 2014-07-31 ENCOUNTER — Telehealth: Payer: Self-pay | Admitting: Cardiology

## 2014-07-31 DIAGNOSIS — R072 Precordial pain: Secondary | ICD-10-CM

## 2014-07-31 DIAGNOSIS — I10 Essential (primary) hypertension: Secondary | ICD-10-CM

## 2014-07-31 MED ORDER — ISOSORBIDE MONONITRATE ER 30 MG PO TB24: ORAL_TABLET | ORAL | Status: DC

## 2014-07-31 NOTE — Telephone Encounter (Signed)
  Patient out of Imdur. Refill called into pharmacy.   Veronica Turner, Veronica Turner 07/31/2014

## 2014-08-09 ENCOUNTER — Ambulatory Visit: Payer: Self-pay

## 2014-09-30 ENCOUNTER — Other Ambulatory Visit: Payer: Self-pay | Admitting: Internal Medicine

## 2014-11-11 ENCOUNTER — Other Ambulatory Visit: Payer: Self-pay | Admitting: Internal Medicine

## 2014-12-30 ENCOUNTER — Ambulatory Visit (HOSPITAL_COMMUNITY)
Admission: RE | Admit: 2014-12-30 | Discharge: 2014-12-30 | Disposition: A | Discharge status: Home / Self Care | Payer: Self-pay | Source: Ambulatory Visit | Attending: Cardiology | Admitting: Cardiology

## 2014-12-30 ENCOUNTER — Encounter: Payer: Self-pay | Admitting: Internal Medicine

## 2014-12-30 ENCOUNTER — Ambulatory Visit: Payer: Self-pay | Attending: Internal Medicine | Admitting: Internal Medicine

## 2014-12-30 ENCOUNTER — Other Ambulatory Visit: Payer: Self-pay

## 2014-12-30 VITALS — BP 163/92 | HR 77 | Temp 98.1°F | Resp 16 | Ht 64.0 in | Wt 183.0 lb

## 2014-12-30 DIAGNOSIS — R079 Chest pain, unspecified: Secondary | ICD-10-CM | POA: Insufficient documentation

## 2014-12-30 DIAGNOSIS — R072 Precordial pain: Secondary | ICD-10-CM | POA: Insufficient documentation

## 2014-12-30 DIAGNOSIS — Z6831 Body mass index (BMI) 31.0-31.9, adult: Secondary | ICD-10-CM | POA: Insufficient documentation

## 2014-12-30 DIAGNOSIS — F17200 Nicotine dependence, unspecified, uncomplicated: Secondary | ICD-10-CM

## 2014-12-30 DIAGNOSIS — I10 Essential (primary) hypertension: Secondary | ICD-10-CM | POA: Insufficient documentation

## 2014-12-30 DIAGNOSIS — F17210 Nicotine dependence, cigarettes, uncomplicated: Secondary | ICD-10-CM | POA: Insufficient documentation

## 2014-12-30 DIAGNOSIS — Z8673 Personal history of transient ischemic attack (TIA), and cerebral infarction without residual deficits: Secondary | ICD-10-CM | POA: Insufficient documentation

## 2014-12-30 DIAGNOSIS — R9431 Abnormal electrocardiogram [ECG] [EKG]: Secondary | ICD-10-CM | POA: Insufficient documentation

## 2014-12-30 DIAGNOSIS — Z7982 Long term (current) use of aspirin: Secondary | ICD-10-CM | POA: Insufficient documentation

## 2014-12-30 DIAGNOSIS — E669 Obesity, unspecified: Secondary | ICD-10-CM | POA: Insufficient documentation

## 2014-12-30 DIAGNOSIS — Z720 Tobacco use: Secondary | ICD-10-CM

## 2014-12-30 MED ORDER — ISOSORBIDE MONONITRATE ER 60 MG PO TB24: ORAL_TABLET | ORAL | Status: DC

## 2014-12-30 NOTE — Progress Notes (Signed)
Pt is here today c/o chest pain and numbness in her left arm.

## 2014-12-30 NOTE — Patient Instructions (Signed)
Please begin taking Isosorbide 60 mg daily. This will give you better control of chest pain and BP. Better control of BP will likely help the chest pain as well. Please stop smoking now!!!! This is very important to your health.

## 2014-12-30 NOTE — Progress Notes (Signed)
Patient ID: Veronica Turner, female   DOB: Sep 23, 1960, 54 y.o.   MRN: [id number]  CC: chest pain, HTN f/u  HPI: Veronica Turner is a 54 y.o. female here today for a follow up visit.  Patient has past medical history of chest pain, TIA, and HTN.  Patient reports that she has had chest pain and left arm numbness since Sunday.  While at work she suffered a hot flash wiith some nausea. She had tightness in her chest that resolved after taking aspirin. She states that she has been taking all of her blood pressure medication and isosorbide. She has been having this pain intermittently since Sunday. She reports that she has been having a rough week at work and is unsure if that exacerbated the chest pain.  She reports that she has been getting medication refills from Dr. Daleen SquibbWall. She checks BP sporadically and it is usually 150's/90's. She continues to smoke 2 cigarettes per day.   Patient has No headache, No abdominal pain - No Nausea, No new weakness tingling or numbness, No Cough - SOB.  Allergies  Allergen Reactions  . Penicillins     Throat closes    Past Medical History  Diagnosis Date  . Hypertension   . TIA (transient ischemic attack) 04/20/12  . Chest pain     a. Normal stress test 02/2014.  . Tobacco abuse   . Obesity    Current Outpatient Prescriptions on File Prior to Visit  Medication Sig Dispense Refill  . aspirin 81 MG tablet Take 1 tablet (81 mg total) by mouth daily. 30 tablet 3  . BIOTIN PO Take 1 tablet by mouth daily.    Marland Kitchen. buPROPion (WELLBUTRIN SR) 150 MG 12 hr tablet Take 1 tablet (150 mg total) by mouth daily. 30 tablet 3  . carvedilol (COREG) 6.25 MG tablet Take 1 tablet (6.25 mg total) by mouth 2 (two) times daily with a meal. 60 tablet 3  . hydrochlorothiazide (HYDRODIURIL) 25 MG tablet Take 1 tablet (25 mg total) by mouth daily. 90 tablet 3  . isosorbide mononitrate (IMDUR) 30 MG 24 hr tablet TAKE 1 TABLET BY MOUTH DAILY. 30 tablet 3  . losartan (COZAAR) 50 MG tablet Take 1  tablet (50 mg total) by mouth daily. 30 tablet 3  . triamcinolone cream (KENALOG) 0.1 % Apply 1 application topically 2 (two) times daily. 30 g 0  . potassium chloride 20 MEQ TBCR Take 1 tablet (20 meq) by mouth daily. (Patient not taking: Reported on 12/30/2014) 30 tablet 3   No current facility-administered medications on file prior to visit.   Family History  Problem Relation Age of Onset  . Heart disease Mother   . Hypertension Mother   . Cancer Father    History   Social History  . Marital Status: Married    Spouse Name: N/A  . Number of Children: N/A  . Years of Education: N/A   Occupational History  . Not on file.   Social History Main Topics  . Smoking status: Current Every Day Smoker -- 0.25 packs/day    Types: Cigarettes  . Smokeless tobacco: Not on file  . Alcohol Use: 0.5 oz/week    1 drink(s) per week  . Drug Use: No  . Sexual Activity: Not on file   Other Topics Concern  . Not on file   Social History Narrative    Review of Systems  Respiratory: Negative.   Cardiovascular: Positive for chest pain. Negative for palpitations, claudication and leg swelling.  Gastrointestinal: Negative for heartburn.  Neurological: Positive for tingling (left arm). Negative for headaches.  All other systems reviewed and are negative.  Objective:   Filed Vitals:   12/30/14 0923  BP: 163/92  Pulse: 77  Temp: 98.1 F (36.7 C)  Resp: 16    Physical Exam  Constitutional: She is oriented to person, place, and time. No distress.  Cardiovascular: Normal rate, regular rhythm and normal heart sounds.   Pulmonary/Chest: Effort normal and breath sounds normal. No respiratory distress.  Musculoskeletal: She exhibits no tenderness.  No tenderness to palpitation of chest  Neurological: She is alert and oriented to person, place, and time.  Skin: Skin is warm and dry. She is not diaphoretic.    Lab Results  Component Value Date   WBC 5.1 02/20/2014   HGB 15.6* 02/20/2014    HCT 46.5* 02/20/2014   MCV 94.3 02/20/2014   PLT 259 02/20/2014   Lab Results  Component Value Date   CREATININE 0.88 02/20/2014   BUN 21 02/20/2014   NA 141 02/20/2014   K 3.8 02/20/2014   CL 102 02/20/2014   CO2 23 02/20/2014    Lab Results  Component Value Date   HGBA1C 5.9* 02/20/2014   Lipid Panel     Component Value Date/Time   CHOL 144 02/20/2014 0306   TRIG 46 02/20/2014 0306   HDL 33* 02/20/2014 0306   CHOLHDL 4.4 02/20/2014 0306   VLDL 9 02/20/2014 0306   LDLCALC 102* 02/20/2014 0306       Assessment and plan:   Veronica Turner was seen today for follow-up.  Diagnoses and all orders for this visit:  Chest pain, unspecified chest pain type Orders: -     CK total and CKMB (cardiac) -     Troponin I EKG: prolonged QT interval, LVH. Discussed case with Scot Juniffany Noel, PA. At this time I feel comfortable discharging patient home with strict return precautions. She had a stress test less than one year ago that was normal. I have addressed risk factors with patient such as tight control of BP and smoking cessation now.  I will increase patients isosorbide to 60 mg for better BP control and chest pain symptoms. If abnormal cardiac enzymes she will need to report to ER.   Essential hypertension Continue regimen but increased isosorbide. DASH diet discussed and stress control  Smoker She will discontinue Wellbutrin and get some nicotine patches. She does not seem ready to stop smoking her 2 cigarettes per day. Smoking cessation information given with recommendations to try nicotine patches or gum.  Return in about 2 weeks (around 01/13/2015) for Nurse Visit-BP check.       Holland CommonsKECK, VALERIE, NP-C [facility] [phone] 12/30/2014, 9:50 AM

## 2014-12-31 LAB — CK TOTAL AND CKMB (NOT AT ARMC)
CK TOTAL: 101 U/L (ref 7–177)
CK, MB: 2.9 ng/mL (ref 0.0–5.0)
Relative Index: 2.9 (ref 0.0–4.0)

## 2014-12-31 LAB — TROPONIN I: TROPONIN I: 0.01 ng/mL (ref <0.06)

## 2015-01-05 ENCOUNTER — Telehealth: Payer: Self-pay | Admitting: *Deleted

## 2015-01-05 NOTE — Progress Notes (Signed)
error 

## 2015-01-05 NOTE — Telephone Encounter (Signed)
-----   Message from Ambrose FinlandValerie A Keck, NP sent at 01/01/2015  9:41 AM EDT ----- Cardiac enzymes are negative. No heart damage indicating heart attack. Please readdress smoking cessation with patient

## 2015-01-10 ENCOUNTER — Encounter (HOSPITAL_COMMUNITY): Payer: Self-pay | Admitting: Family Medicine

## 2015-01-10 ENCOUNTER — Emergency Department (INDEPENDENT_AMBULATORY_CARE_PROVIDER_SITE_OTHER)
Admission: EM | Admit: 2015-01-10 | Discharge: 2015-01-10 | Disposition: A | Discharge status: Home / Self Care | Payer: Self-pay | Source: Home / Self Care | Attending: Family Medicine | Admitting: Family Medicine

## 2015-01-10 DIAGNOSIS — L0291 Cutaneous abscess, unspecified: Secondary | ICD-10-CM

## 2015-01-10 MED ORDER — CEPHALEXIN 500 MG PO CAPS: 500.0000 mg | ORAL_CAPSULE | Freq: Three times a day (TID) | ORAL | Status: DC

## 2015-01-10 MED ORDER — TETANUS-DIPHTH-ACELL PERTUSSIS 5-2.5-18.5 LF-MCG/0.5 IM SUSP
0.5000 mL | Freq: Once | INTRAMUSCULAR | Status: AC
  Administered 2015-01-10: 0.5 mL via INTRAMUSCULAR

## 2015-01-10 MED ORDER — FLUCONAZOLE 150 MG PO TABS: 150.0000 mg | ORAL_TABLET | Freq: Every day | ORAL | Status: DC

## 2015-01-10 MED ORDER — TETANUS-DIPHTH-ACELL PERTUSSIS 5-2.5-18.5 LF-MCG/0.5 IM SUSP
INTRAMUSCULAR | Status: AC
  Filled 2015-01-10: qty 0.5

## 2015-01-10 NOTE — ED Provider Notes (Signed)
CSN: [id number]     Arrival date & time 01/10/15  0807 History   First MD Initiated Contact with Patient 01/10/15 [phone]     Chief Complaint  Patient presents with  . Foot Pain   (Consider location/radiation/quality/duration/timing/severity/associated sxs/prior Treatment) HPI  Right foot pain. Started 2 days ago when patient stepped on a toothpick in her rug. Patient states that she was unable to remove the toothpick for several days. Area has become swollen red and painful. Intermittent purulent discharge. Symptoms are constant. Hydrogen peroxide and warm soapy soaks without improvement. Denies fevers, chest pain, rash, nausea, vomiting, abdominal pain, headache. Patient is been compliant with all of her other medications at this point time. Patient is unsure of her last tetanus shot.   Past Medical History  Diagnosis Date  . Hypertension   . TIA (transient ischemic attack) 04/20/12  . Chest pain     a. Normal stress test 02/2014.  . Tobacco abuse   . Obesity    Past Surgical History  Procedure Laterality Date  . Cesarean section    . 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 Heart disease Mother   . Hypertension Mother   . Cancer Father    History  Substance Use Topics  . Smoking status: Current Every Day Smoker -- 0.25 packs/day    Types: Cigarettes  . Smokeless tobacco: Not on file  . Alcohol Use: 0.5 oz/week    1 drink(s) per week   OB History    Gravida Para Term Preterm AB TAB SAB Ectopic Multiple Living   7 5 3 2 2  0 2 0 0 5     Review of Systems Per HPI with all other pertinent systems negative.   Allergies  Penicillins  Home Medications   Prior to Admission medications   Medication Sig Start Date End Date Taking? Authorizing Provider  aspirin 81 MG tablet Take 1 tablet (81 mg total) by mouth daily. 07/01/14   Ambrose FinlandValerie A Keck, NP  BIOTIN PO Take 1 tablet by mouth daily.    Historical Provider, MD   carvedilol (COREG) 6.25 MG tablet Take 1 tablet (6.25 mg total) by mouth 2 (two) times daily with a meal. 07/01/14   Ambrose FinlandValerie A Keck, NP  cephALEXin (KEFLEX) 500 MG capsule Take 1 capsule (500 mg total) by mouth 3 (three) times daily. 01/10/15   Ozella Rocksavid J Merrell, MD  fluconazole (DIFLUCAN) 150 MG tablet Take 1 tablet (150 mg total) by mouth daily. Repeat dose in 3 days 01/10/15   Ozella Rocksavid J Merrell, MD  hydrochlorothiazide (HYDRODIURIL) 25 MG tablet Take 1 tablet (25 mg total) by mouth daily. 07/01/14   Ambrose FinlandValerie A Keck, NP  isosorbide mononitrate (IMDUR) 60 MG 24 hr tablet TAKE 1 TABLET BY MOUTH DAILY. 12/30/14   Ambrose FinlandValerie A Keck, NP  losartan (COZAAR) 50 MG tablet Take 1 tablet (50 mg total) by mouth daily. 07/01/14   Ambrose FinlandValerie A Keck, NP  potassium chloride 20 MEQ TBCR Take 1 tablet (20 meq) by mouth daily. Patient not taking: Reported on 12/30/2014 02/21/14   Dayna N Dunn, PA-C  triamcinolone cream (KENALOG) 0.1 % Apply 1 application topically 2 (two) times daily. 07/01/14   Ambrose FinlandValerie A Keck, NP   BP 171/110 mmHg  Pulse 81  Temp(Src) 97.9 F (36.6 C) (Oral)  Resp 16  SpO2 98%  LMP 07/01/2014 Physical Exam Physical Exam  Constitutional: oriented to person, place, and  time. appears well-developed and well-nourished. No distress.  HENT:  Head: Normocephalic and atraumatic.  Eyes: EOMI. PERRL.  Neck: Normal range of motion.  Cardiovascular: RRR, no m/r/g, 2+ distal pulses,  Pulmonary/Chest: Effort normal and breath sounds normal. No respiratory distress.  Abdominal: Soft. Bowel sounds are normal. NonTTP, no distension.  Musculoskeletal: Normal range of motion. Non ttp, no effusion.  Neurological: alert and oriented to person, place, and time.  Skin: Right lateral midfoot with area of induration and central fluctuance approximately 1 x 2 cm. Tender to palpation.  Psychiatric: normal mood and affect. behavior is normal. Judgment and thought content normal.   ED Course  INCISION AND DRAINAGE Date/Time:  01/10/2015 8:54 AM Performed by: Konrad Dolores, DAVID J Authorized by: Konrad Dolores, DAVID J Consent: Verbal consent obtained. Risks and benefits: risks, benefits and alternatives were discussed Consent given by: patient Patient identity confirmed: verbally with patient Type: abscess Location: Right plantar surface mid foot laterally. Local anesthetic: lidocaine 2% without epinephrine Anesthetic total: 5 ml Scalpel size: 11 Incision type: single straight Complexity: simple Drainage: purulent Drainage amount: moderate Wound treatment: wound left open Patient tolerance: Patient tolerated the procedure well with no immediate complications   (including critical care time) Labs Review Labs Reviewed - No data to display  Imaging Review No results found.   MDM   1. Abscess    Abscess drained as above. Ointment applied. Start Keflex. Abscess culture sent. Patient follow-up with her PCP regarding hypertension. Anticipate elevation today likely secondary to pain. Tetanus updated.    Ozella Rocks, MD 01/10/15 [phone]

## 2015-01-10 NOTE — Discharge Instructions (Signed)
Urinated by a toothache which caused an abscess in her foot. This was drained in our clinic. Your tetanus is now up-to-date. Please start the antibiotics and take them for the full 7 days. Please apply antibiotic ointment and a bandage as needed. This should heal over very quickly. Please use the Diflucan if you develop a yeast infection. May return to work as your foot pain and symptoms permit.

## 2015-01-10 NOTE — ED Notes (Signed)
Pt stepped on a toothpick about 10 days ago.  The foot is now swollen and sore.  She denies any fevers or any other issues.

## 2015-01-12 ENCOUNTER — Telehealth: Payer: Self-pay | Admitting: *Deleted

## 2015-01-12 NOTE — Telephone Encounter (Signed)
Left message on patients cell phone to return call.  

## 2015-01-12 NOTE — Telephone Encounter (Signed)
-----   Message from Ambrose FinlandValerie A Keck, NP sent at 01/01/2015  9:41 AM EDT ----- Cardiac enzymes are negative. No heart damage indicating heart attack. Please readdress smoking cessation with patient

## 2015-01-13 ENCOUNTER — Telehealth: Payer: Self-pay | Admitting: *Deleted

## 2015-01-13 LAB — CULTURE, ROUTINE-ABSCESS: Special Requests: NORMAL

## 2015-01-13 NOTE — Telephone Encounter (Signed)
Results given and smoking cessation discussed.  Patient states she is only smoking 2 cigarettes per day.

## 2015-01-21 NOTE — ED Notes (Signed)
Final report of foot abscess, positive for group C strep. Discussed w Dr Griffin BasilJD Kindl, who advises this will be adequately treated w Rx written day of [facility] visit

## 2015-02-03 ENCOUNTER — Encounter: Payer: Self-pay | Admitting: Internal Medicine

## 2015-02-03 ENCOUNTER — Ambulatory Visit: Payer: Self-pay | Attending: Internal Medicine | Admitting: Internal Medicine

## 2015-02-03 VITALS — BP 172/117 | HR 87 | Wt 180.6 lb

## 2015-02-03 DIAGNOSIS — I10 Essential (primary) hypertension: Secondary | ICD-10-CM

## 2015-02-03 DIAGNOSIS — F17200 Nicotine dependence, unspecified, uncomplicated: Secondary | ICD-10-CM

## 2015-02-03 DIAGNOSIS — M79606 Pain in leg, unspecified: Secondary | ICD-10-CM

## 2015-02-03 DIAGNOSIS — Z720 Tobacco use: Secondary | ICD-10-CM

## 2015-02-03 DIAGNOSIS — I868 Varicose veins of other specified sites: Secondary | ICD-10-CM

## 2015-02-03 DIAGNOSIS — I8390 Asymptomatic varicose veins of unspecified lower extremity: Secondary | ICD-10-CM

## 2015-02-03 LAB — COMPLETE METABOLIC PANEL WITH GFR
ALK PHOS: 92 U/L (ref 39–117)
ALT: 21 U/L (ref 0–35)
AST: 30 U/L (ref 0–37)
Albumin: 4.1 g/dL (ref 3.5–5.2)
BUN: 13 mg/dL (ref 6–23)
CO2: 29 meq/L (ref 19–32)
CREATININE: 0.84 mg/dL (ref 0.50–1.10)
Calcium: 9.5 mg/dL (ref 8.4–10.5)
Chloride: 96 mEq/L (ref 96–112)
GFR, Est Non African American: 80 mL/min
Glucose, Bld: 94 mg/dL (ref 70–99)
Potassium: 3.4 mEq/L — ABNORMAL LOW (ref 3.5–5.3)
SODIUM: 137 meq/L (ref 135–145)
TOTAL PROTEIN: 7.1 g/dL (ref 6.0–8.3)
Total Bilirubin: 0.8 mg/dL (ref 0.2–1.2)

## 2015-02-03 MED ORDER — LOSARTAN POTASSIUM 100 MG PO TABS: 100.0000 mg | ORAL_TABLET | Freq: Every day | ORAL | Status: DC

## 2015-02-03 MED ORDER — FLUCONAZOLE 150 MG PO TABS: 150.0000 mg | ORAL_TABLET | Freq: Every day | ORAL | Status: DC

## 2015-02-03 NOTE — Patient Instructions (Addendum)
Go to shoe market to get some compression socks. They can help you to find the right size for your legs. May take tylenol for pain as needed. NO IBUPROFEN---it will increase your blood pressure  The medical supplies store on [address] may also be able to help.   Come back in 2 weeks so I can check your BP again.  I have increased the losartan to 100 mg once daily!!!

## 2015-02-03 NOTE — Progress Notes (Signed)
Patient ID: Veronica Turner, female   DOB: [birth year], 54 y.o.   MRN: [id number]  CC: leg pain  HPI: Veronica Turner is a 54 y.o. female here today for a follow up visit.  Patient has past medical history of HTN, TIA, chest pain, obesity. Patient c/o of leg pain today for the past 3-4 weeks. She states that she has increased her hours at work and has noticed more pain since then. She now works 9 hours shifts constantly standing and has had varicose veins for several years. She would like to see a vein specialist when she gets the money to go. She states that when her legs begin to ache she notices bulging of several veins in her legs.    Allergies  Allergen Reactions  . Penicillins     Throat closes    Past Medical History  Diagnosis Date  . Hypertension   . TIA (transient ischemic attack) 04/20/12  . Chest pain     a. Normal stress test 02/2014.  . Tobacco abuse   . Obesity    Current Outpatient Prescriptions on File Prior to Visit  Medication Sig Dispense Refill  . aspirin 81 MG tablet Take 1 tablet (81 mg total) by mouth daily. 30 tablet 3  . BIOTIN PO Take 1 tablet by mouth daily.    . carvedilol (COREG) 6.25 MG tablet Take 1 tablet (6.25 mg total) by mouth 2 (two) times daily with a meal. 60 tablet 3  . hydrochlorothiazide (HYDRODIURIL) 25 MG tablet Take 1 tablet (25 mg total) by mouth daily. 90 tablet 3  . isosorbide mononitrate (IMDUR) 60 MG 24 hr tablet TAKE 1 TABLET BY MOUTH DAILY. 30 tablet 4  . losartan (COZAAR) 50 MG tablet Take 1 tablet (50 mg total) by mouth daily. 30 tablet 3  . potassium chloride 20 MEQ TBCR Take 1 tablet (20 meq) by mouth daily. 30 tablet 3  . triamcinolone cream (KENALOG) 0.1 % Apply 1 application topically 2 (two) times daily. 30 g 0  . cephALEXin (KEFLEX) 500 MG capsule Take 1 capsule (500 mg total) by mouth 3 (three) times daily. (Patient not taking: Reported on 02/03/2015) 21 capsule 0  . fluconazole (DIFLUCAN) 150 MG tablet Take 1 tablet (150 mg  total) by mouth daily. Repeat dose in 3 days (Patient not taking: Reported on 02/03/2015) 2 tablet 0   No current facility-administered medications on file prior to visit.   Family History  Problem Relation Age of Onset  . Heart disease Mother   . Hypertension Mother   . Cancer Father    History   Social History  . Marital Status: Married    Spouse Name: N/A  . Number of Children: N/A  . Years of Education: N/A   Occupational History  . Not on file.   Social History Main Topics  . Smoking status: Current Every Day Smoker -- 0.25 packs/day    Types: Cigarettes  . Smokeless tobacco: Not on file  . Alcohol Use: 0.5 oz/week    1 drink(s) per week  . Drug Use: No  . Sexual Activity: Not on file   Other Topics Concern  . Not on file   Social History Narrative    Review of Systems  Cardiovascular: Positive for leg swelling (varicose veins). Negative for chest pain, palpitations, orthopnea and claudication.  Musculoskeletal: Positive for myalgias.       Leg pain  Neurological: Negative for dizziness, tingling and headaches.      Objective:  Filed Vitals:   02/03/15 1200  BP: 147/109  Pulse: 87    Physical Exam  Constitutional: She is oriented to person, place, and time.  Cardiovascular: Normal rate, regular rhythm and normal heart sounds.   Pulmonary/Chest: Effort normal and breath sounds normal.  Neurological: She is alert and oriented to person, place, and time.  Skin:  Severe varicose veins bilaterally     Lab Results  Component Value Date   WBC 5.1 02/20/2014   HGB 15.6* 02/20/2014   HCT 46.5* 02/20/2014   MCV 94.3 02/20/2014   PLT 259 02/20/2014   Lab Results  Component Value Date   CREATININE 0.88 02/20/2014   BUN 21 02/20/2014   NA 141 02/20/2014   K 3.8 02/20/2014   CL 102 02/20/2014   CO2 23 02/20/2014    Lab Results  Component Value Date   HGBA1C 5.9* 02/20/2014   Lipid Panel     Component Value Date/Time   CHOL 144 02/20/2014  0306   TRIG 46 02/20/2014 0306   HDL 33* 02/20/2014 0306   CHOLHDL 4.4 02/20/2014 0306   VLDL 9 02/20/2014 0306   LDLCALC 102* 02/20/2014 0306       Assessment and plan:   Veronica Turner was seen today for leg pain.  Diagnoses and all orders for this visit:  Pain of lower extremity, unspecified laterality Advised patient to get compression hose to use while working to help with circulation. She may take tylenol for pain. Advised against all NSAID's.   Varicose Veins Explained why the varicosity is causing her pain and swelling. See above   Essential hypertension Orders: -     COMPLETE METABOLIC PANEL WITH GFR -    Increased losartan (COZAAR) 100 MG tablet; Take 1 tablet (100 mg total) by mouth daily. I have increased her losartan to 100 mg per day, she is still uncontrolled wven with reported medication compliance. Stressed smoking cessation.  Smoker Reports that she only smokes one cigarette per day. I have went over complications that it is causing to her body. Patient states that she will go buy nicotine patches tomorrow.    Return for 2-3 weeks RN--BP check and 3 mo PCP .        Holland Commons, NP-C [facility] [phone] 02/03/2015, 12:13 PM

## 2015-02-03 NOTE — Progress Notes (Signed)
Pt present today for leg pain for a couple weeks. She states she is working more hours at work.

## 2015-02-07 NOTE — Progress Notes (Signed)
LMTCB

## 2015-03-10 ENCOUNTER — Ambulatory Visit: Payer: Self-pay | Attending: Internal Medicine

## 2015-04-04 ENCOUNTER — Ambulatory Visit: Payer: Self-pay | Attending: Internal Medicine | Admitting: *Deleted

## 2015-04-04 VITALS — BP 130/86 | HR 83 | Temp 98.4°F | Resp 20 | Ht 64.0 in | Wt 180.6 lb

## 2015-04-04 DIAGNOSIS — I10 Essential (primary) hypertension: Secondary | ICD-10-CM | POA: Insufficient documentation

## 2015-04-04 DIAGNOSIS — Z720 Tobacco use: Secondary | ICD-10-CM | POA: Insufficient documentation

## 2015-04-04 NOTE — Progress Notes (Signed)
Patient presents for BP check  Med list reviewed; states taking all meds as directed except has been taking coreg only once daily instead of bid. States she didn't know to take bid Patient is not adding salt to foods or cooking with salt and is using Mrs Sharilyn Sites as alternative to salt.  Usually eats fresh foods only but last 2 weeks has been eating fast food  C/o intermittent headaches X 2 weeks and constant headache X 2 days; rates 5-6 /10 at present Walking 30 minutes twice daily for exercise (walks to and from work in hot sun) Also does squats and lifts Patient denies SHOB, chest pain  Positive for blurred vision X 2 weeks Patirent is not wearing sunglasses or hat with visor when walking. States she also works in hot environment. Patient advised to increase fluid intake, wear hat and or sunglasses when outside Patient will begin taking coreg 6.25 mg bid as previously directed   Filed Vitals:   04/04/15 1144  BP: 130/86  Pulse: 83  Temp: 98.4 F (36.9 C)  Resp: 20     Patient advised to call for med refills at least 7 days before running out so as not to go without.  Patient aware that she is to f/u with PCP 3 months from last visit (Due 05/06/15)

## 2015-06-22 ENCOUNTER — Other Ambulatory Visit: Payer: Self-pay | Admitting: Internal Medicine

## 2015-08-19 ENCOUNTER — Other Ambulatory Visit: Payer: Self-pay | Admitting: Internal Medicine

## 2015-08-26 MED FILL — CARVEDILOL 6.25 MG TABLET: 6.25 | 30 days supply | Qty: 60 | Fill #1

## 2015-08-26 MED FILL — HYDROCHLOROTHIAZIDE 25 MG T: 25 | 30 days supply | Qty: 30 | Fill #0

## 2015-08-26 MED FILL — LOSARTAN POTASSIUM 100 MG T: 100 | 30 days supply | Qty: 30 | Fill #4

## 2015-08-31 MED FILL — ISOSORBIDE MN ER 60 MG TAB: 60 | 30 days supply | Qty: 30 | Fill #0

## 2015-09-01 ENCOUNTER — Encounter: Payer: Self-pay | Admitting: Internal Medicine

## 2015-09-01 ENCOUNTER — Ambulatory Visit: Payer: Self-pay | Attending: Internal Medicine | Admitting: Internal Medicine

## 2015-09-01 VITALS — BP 152/104 | HR 74 | Temp 98.0°F | Resp 16 | Ht 64.0 in | Wt 175.0 lb

## 2015-09-01 DIAGNOSIS — M25551 Pain in right hip: Secondary | ICD-10-CM

## 2015-09-01 DIAGNOSIS — I10 Essential (primary) hypertension: Secondary | ICD-10-CM

## 2015-09-01 DIAGNOSIS — M25552 Pain in left hip: Secondary | ICD-10-CM

## 2015-09-01 DIAGNOSIS — IMO0001 Reserved for inherently not codable concepts without codable children: Secondary | ICD-10-CM

## 2015-09-01 DIAGNOSIS — R030 Elevated blood-pressure reading, without diagnosis of hypertension: Secondary | ICD-10-CM

## 2015-09-01 MED ORDER — CLONIDINE HCL 0.1 MG PO TABS
0.2000 mg | ORAL_TABLET | Freq: Once | ORAL | Status: AC
  Administered 2015-09-01: 0.2 mg via ORAL

## 2015-09-01 MED ORDER — HYDRALAZINE HCL 10 MG PO TABS: 10.0000 mg | ORAL_TABLET | Freq: Three times a day (TID) | ORAL | Status: DC

## 2015-09-01 MED ORDER — TRAMADOL HCL 50 MG PO TABS: 50.0000 mg | ORAL_TABLET | Freq: Two times a day (BID) | ORAL | Status: DC | PRN

## 2015-09-01 MED FILL — hydrALAZINE HCL 10 MG TABS: 10 | 30 days supply | Qty: 90 | Fill #0

## 2015-09-01 NOTE — Progress Notes (Signed)
Patient ID: Veronica Turner, female   DOB: 19-Mar-1961, 55 y.o.   MRN: [id number]  CC: bilateral hip pain, HTN  HPI: Veronica Guarnerinnette Matousek is a 55 y.o. female here today for a follow up visit.  Patient has past medical history of HTN, TIA, and tobacco use. Patient reports that she has been having bilateral hip pain intermittently for one year. She has tried Naproxen and Tramadol for pain. Pain aggravated by walking and climbing stairs. She denies injury. Pain is achy and affects her sleep. Patient reports that she has not been back to Cardiology since last year. She is concerned because her daughter recently had a stroke and required a defibrillator. After the procedure she was told she has a heart condition that can cause her to die suddenly. Patient believes it is a prolong QT interval. Mom passed away at 47 due to cardiac issues. Patient stopped smoking last month. She has been taking her blood pressure medication as directed but pressures have still been elevated. Allergies  Allergen Reactions  . Penicillins     Throat closes    Past Medical History  Diagnosis Date  . Hypertension   . TIA (transient ischemic attack) 04/20/12  . Chest pain     a. Normal stress test 02/2014.  . Tobacco abuse   . Obesity    Current Outpatient Prescriptions on File Prior to Visit  Medication Sig Dispense Refill  . aspirin 81 MG tablet Take 1 tablet (81 mg total) by mouth daily. 30 tablet 3  . carvedilol (COREG) 6.25 MG tablet TAKE 1 TABLET BY MOUTH TWICE DAILY WITH A MEAL 60 tablet 3  . isosorbide mononitrate (IMDUR) 60 MG 24 hr tablet Take 1 tablet (60 mg total) by mouth daily. Needs office visit for more refills 30 tablet 0  . losartan (COZAAR) 100 MG tablet Take 1 tablet (100 mg total) by mouth daily. 30 tablet 5  . BIOTIN PO Take 1 tablet by mouth daily.    . hydrochlorothiazide (HYDRODIURIL) 25 MG tablet Take 1 tablet (25 mg total) by mouth daily. Needs office visit for more refills 30 tablet 0  . potassium  chloride 20 MEQ TBCR Take 1 tablet (20 meq) by mouth daily. (Patient not taking: Reported on 04/04/2015) 30 tablet 3   No current facility-administered medications on file prior to visit.   Family History  Problem Relation Age of Onset  . Heart disease Mother   . Hypertension Mother   . Cancer Father    Social History   Social History  . Marital Status: Married    Spouse Name: N/A  . Number of Children: N/A  . Years of Education: N/A   Occupational History  . Not on file.   Social History Main Topics  . Smoking status: Current Every Day Smoker -- 0.25 packs/day    Types: Cigarettes  . Smokeless tobacco: Not on file     Comment: Smoking 2 cigs per day  . Alcohol Use: 0.6 oz/week    1 Standard drinks or equivalent per week  . Drug Use: No  . Sexual Activity: Not on file   Other Topics Concern  . Not on file   Social History Narrative    Review of Systems: Other than what is stated in HPI, all other systems are negative.   Objective:   Filed Vitals:   09/01/15 1614  BP: 184/107  Pulse: 90  Temp: 98 F (36.7 C)  Resp: 16    Physical Exam  Constitutional: She is  oriented to person, place, and time.  Cardiovascular: Normal rate, regular rhythm and normal heart sounds.   No murmur heard. Pulmonary/Chest: Effort normal and breath sounds normal.  Musculoskeletal: Normal range of motion. She exhibits no edema or tenderness.  Neurological: She is alert and oriented to person, place, and time.  Skin: Skin is warm and dry.  Psychiatric: She has a normal mood and affect.     Lab Results  Component Value Date   WBC 5.1 02/20/2014   HGB 15.6* 02/20/2014   HCT 46.5* 02/20/2014   MCV 94.3 02/20/2014   PLT 259 02/20/2014   Lab Results  Component Value Date   CREATININE 0.84 02/03/2015   BUN 13 02/03/2015   NA 137 02/03/2015   K 3.4* 02/03/2015   CL 96 02/03/2015   CO2 29 02/03/2015    Lab Results  Component Value Date   HGBA1C 5.9* 02/20/2014   Lipid  Panel     Component Value Date/Time   CHOL 144 02/20/2014 0306   TRIG 46 02/20/2014 0306   HDL 33* 02/20/2014 0306   CHOLHDL 4.4 02/20/2014 0306   VLDL 9 02/20/2014 0306   LDLCALC 102* 02/20/2014 0306       Assessment and plan:   Diagnoses and all orders for this visit:  Elevated blood pressure -     cloNIDine (CATAPRES) tablet 0.2 mg; Take 2 tablets (0.2 mg total) by mouth once in office.  HTN (hypertension), malignant -     Ambulatory referral to Cardiology -     hydrALAZINE (APRESOLINE) 10 MG tablet; Take 1 tablet (10 mg total) by mouth 3 (three) times daily. Due to patients history, I will set her up with a new cardiologist. I will add hydralazine to her regimen. Review of prior EKG's she also had some prolonged QTc interval as well.   Bilateral hip pain -     Ambulatory referral to Orthopedic Surgery -     traMADol (ULTRAM) 50 MG tablet; Take 1 tablet (50 mg total) by mouth every 12 (twelve) hours as needed. Since patient has had this pain for over one year and it is progressively worsening, I will refer her out to Orthopedics.    Return in about 1 week (around 09/08/2015) for Nurse Visit-BP check and 3 mo PCP .   Ambrose Finland, NP-C [facility] [phone] 09/01/2015, 4:37 PM

## 2015-09-01 NOTE — Progress Notes (Signed)
Patient complains of having bilateral hip pain that started about two months ago Patient presents in office with elevated blood pressure today and is complaining of having a headache Patient did state she took her medications today

## 2015-09-05 ENCOUNTER — Ambulatory Visit: Payer: Self-pay | Attending: Internal Medicine

## 2015-09-15 NOTE — Progress Notes (Signed)
HPI: 55 yo female for evaluation of hypertension. Admitted 7/15 with CP. Nuclear study 7/15 showed EF 57 and no ischemia or infarction. Previously followed by Dr Daleen Squibb. Patient denies dyspnea, chest pain or syncope.  Current Outpatient Prescriptions  Medication Sig Dispense Refill  . aspirin 81 MG tablet Take 1 tablet (81 mg total) by mouth daily. 30 tablet 3  . carvedilol (COREG) 6.25 MG tablet TAKE 1 TABLET BY MOUTH TWICE DAILY WITH A MEAL 60 tablet 3  . hydrALAZINE (APRESOLINE) 10 MG tablet Take 1 tablet (10 mg total) by mouth 3 (three) times daily. (Patient taking differently: Take 10 mg by mouth daily. ) 90 tablet 1  . hydrochlorothiazide (HYDRODIURIL) 25 MG tablet Take 1 tablet (25 mg total) by mouth daily. Needs office visit for more refills 30 tablet 0  . isosorbide mononitrate (IMDUR) 60 MG 24 hr tablet Take 1 tablet (60 mg total) by mouth daily. Needs office visit for more refills 30 tablet 0  . losartan (COZAAR) 100 MG tablet Take 1 tablet (100 mg total) by mouth daily. 30 tablet 5  . meloxicam (MOBIC) 7.5 MG tablet Take one by mouth twice daily for hip pain 60 tablet 1  . traMADol (ULTRAM) 50 MG tablet Take 1 tablet (50 mg total) by mouth every 12 (twelve) hours as needed. 30 tablet 0   No current facility-administered medications for this visit.    Allergies  Allergen Reactions  . Penicillins Anaphylaxis    Throat closes      Past Medical History  Diagnosis Date  . Hypertension   . TIA (transient ischemic attack) 04/20/12  . Chest pain     a. Normal stress test 02/2014.  . Tobacco abuse   . Obesity     Past Surgical History  Procedure Laterality Date  . Cesarean section    . 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 Marital Status: Married    Spouse Name: N/A  . Number of Children: N/A  . Years of Education: N/A   Occupational History  . Not on file.   Social History Main Topics  . Smoking status: Current Every Day  Smoker -- 0.25 packs/day    Types: Cigarettes  . Smokeless tobacco: Not on file     Comment: Smoking 2 cigs per day  . Alcohol Use: 0.6 oz/week    1 Standard drinks or equivalent per week  . Drug Use: No  . Sexual Activity: Not on file   Other Topics Concern  . Not on file   Social History Narrative    Family History  Problem Relation Age of Onset  . Heart disease Mother   . Hypertension Mother   . Cancer Father     ROS:  Headaches and nausea but no fevers or chills, productive cough, hemoptysis, dysphasia, odynophagia, melena, hematochezia, dysuria, hematuria, rash, seizure activity, orthopnea, PND, pedal edema, claudication. Remaining systems are negative.  Physical Exam:   Blood pressure 200/120, pulse 83, height  (1.626 m), weight 169 lb 11.2 oz (76.975 kg), last menstrual period 07/01/2014.  General:  Well developed/well nourished in NAD Skin warm/dry Patient not depressed No peripheral clubbing Back-normal HEENT-normal/normal eyelids Neck supple/normal carotid upstroke bilaterally; no bruits; no JVD; no thyromegaly chest - CTA/ normal expansion CV - RRR/normal S1 and S2; no murmurs, rubs or gallops;  PMI nondisplaced Abdomen -NT/ND, no HSM, no mass, + bowel sounds, positive bruit  2+ femoral pulses, no bruits Ext-no edema, chords, 2+ DP Neuro-grossly nonfocal  ECG Sinus rhythm, biatrial enlargement, lateral T-wave inversion.

## 2015-09-16 ENCOUNTER — Encounter: Payer: Self-pay | Admitting: Family Medicine

## 2015-09-16 ENCOUNTER — Ambulatory Visit (INDEPENDENT_AMBULATORY_CARE_PROVIDER_SITE_OTHER): Payer: Self-pay | Admitting: Family Medicine

## 2015-09-16 VITALS — BP 159/89 | Ht 64.0 in | Wt 175.0 lb

## 2015-09-16 DIAGNOSIS — M25559 Pain in unspecified hip: Secondary | ICD-10-CM

## 2015-09-16 MED ORDER — MELOXICAM 7.5 MG PO TABS: ORAL_TABLET | ORAL | Status: DC

## 2015-09-16 MED FILL — MELOXICAM 7.5 MG TABLET: 7.5 | 30 days supply | Qty: 60 | Fill #0

## 2015-09-16 NOTE — Patient Instructions (Signed)
Start mobic twice daily for hip pain. Do not take ibuprofen or Aleve with it. Xrays are ordered. RTC 1 week

## 2015-09-19 ENCOUNTER — Encounter: Payer: Self-pay | Admitting: Cardiology

## 2015-09-19 ENCOUNTER — Ambulatory Visit (INDEPENDENT_AMBULATORY_CARE_PROVIDER_SITE_OTHER): Payer: No Typology Code available for payment source | Admitting: Cardiology

## 2015-09-19 VITALS — BP 200/120 | HR 83 | Ht 64.0 in | Wt 169.7 lb

## 2015-09-19 DIAGNOSIS — I10 Essential (primary) hypertension: Secondary | ICD-10-CM

## 2015-09-19 DIAGNOSIS — Z720 Tobacco use: Secondary | ICD-10-CM

## 2015-09-19 DIAGNOSIS — M25559 Pain in unspecified hip: Secondary | ICD-10-CM | POA: Insufficient documentation

## 2015-09-19 DIAGNOSIS — F17200 Nicotine dependence, unspecified, uncomplicated: Secondary | ICD-10-CM

## 2015-09-19 DIAGNOSIS — R0989 Other specified symptoms and signs involving the circulatory and respiratory systems: Secondary | ICD-10-CM | POA: Insufficient documentation

## 2015-09-19 MED ORDER — AMLODIPINE BESYLATE 5 MG PO TABS: 5.0000 mg | ORAL_TABLET | Freq: Every day | ORAL | Status: DC

## 2015-09-19 MED ORDER — CARVEDILOL 12.5 MG PO TABS: 12.5000 mg | ORAL_TABLET | Freq: Two times a day (BID) | ORAL | Status: DC

## 2015-09-19 MED FILL — ?AMLODIPINE BESYLATE 5 MG T: 5 | 30 days supply | Qty: 30 | Fill #0

## 2015-09-19 MED FILL — CARVEDILOL 12.5 MG TABLET: 12.5 | 30 days supply | Qty: 60 | Fill #0

## 2015-09-19 NOTE — Assessment & Plan Note (Signed)
Patient counseled on discontinuing. 

## 2015-09-19 NOTE — Assessment & Plan Note (Signed)
Abdominal ultrasound to exclude aneurysm. 

## 2015-09-19 NOTE — Assessment & Plan Note (Signed)
Exam does not reveal much except some mild tenderness to palpation over the right greater trochanteric bursa. I think we need to get hip x-rays and then see her back next week. Potentially this could be SI joint pain although it's a bit puzzling.

## 2015-09-19 NOTE — Assessment & Plan Note (Addendum)
Blood pressure elevated.However she did not take her medications prior to arrival. She took those in the office today. I will increase her carvedilol to 12.5 mg twice a day. Add Norvasc 5 mg daily. She is only taking 10 mg of hydralazine daily and I will discontinue this for now. She will follow her blood pressure at home and bring records to next office visit. She will also bring her cuff to correlate with ours. Titrate medications as needed. Echocardiogram to assess LV function and wall thickness.  Note blood pressure after her a.m. Medications was 180/100.She will be discharged home with medication adjustments as outlined.

## 2015-09-19 NOTE — Patient Instructions (Signed)
Medication Instructions:   STOP HYDRALAZINE  INCREASE CARVEDILOL TO 12.5 MG TWICE DAILY= 2 OF THE 6.25 MG TABLETS TWICE DAILY  START AMLODIPINE 5 MG ONCE DAILY  Testing/Procedures:  Your physician has requested that you have an echocardiogram. Echocardiography is a painless test that uses sound waves to create images of your heart. It provides your doctor with information about the size and shape of your heart and how well your heart's chambers and valves are working. This procedure takes approximately one hour. There are no restrictions for this procedure.   Your physician has requested that you have an abdominal aorta duplex. During this test, an ultrasound is used to evaluate the aorta. Allow 30 minutes for this exam. Do not eat after midnight the day before and avoid carbonated beverages   Follow-Up:  Your physician recommends that you schedule a follow-up appointment in: 8 WEEKS WITH DR Jens Som   If you need a refill on your cardiac medications before your next appointment, please call your pharmacy.

## 2015-09-19 NOTE — Progress Notes (Signed)
   Subjective:    Patient ID: Veronica Turner, female    DOB: Aug 11, 1961, 55 y.o.   MRN: [id number]  HPI  bilateral hip pain  For the last year she's had gradually increasing pain in both hips. Bothers her every day. Is worse with standing for long periods of time,  Stairclimbing. Has had no history of hip injury or hip surgery. Pain is aching in nature , 2-7 out of 10. Has tried over-the-counter remedies without any improvement.  PERTINENT  PMH / PSH: I have reviewed the patient's medications, allergies, past medical and surgical history. Pertinent findings that relate to today's visit / issues include:  No specific hip injury  Former smoker,she stopped recently.  BMI is 29    Review of Systems  No unusual weight change recently. No fever, sweats, chills. No other unusual joint pains.    Objective:   Physical Exam   vital signs reviewed GEN.: Well-developed overweight female no acute distress HIPS: Internal/external rotation in seated position reveals full range of motion that is painless. I lateral hip flexion extension is full and painless. Axial loading of the right hip reproduces some mild pain. Axial loading of the left hip is painless. Negative Trendelenburg on stance or gait. No tenderness to palpation over the PSIS on either side.  Mildly tender to palpation of the right greater trochanteric bursa but this does not exactly reproduce her pain. Is no tenderness over the left greater trochanteric bursa.  GAIT: very slight amount of gin valgus. She has antalgic gait limping on the right side mildly. Normal stride length. Normal swing and stance phase bilaterally.      Assessment & Plan:

## 2015-09-23 ENCOUNTER — Ambulatory Visit: Payer: Self-pay | Admitting: Family Medicine

## 2015-10-03 ENCOUNTER — Other Ambulatory Visit (HOSPITAL_COMMUNITY): Payer: No Typology Code available for payment source

## 2015-10-03 ENCOUNTER — Inpatient Hospital Stay (HOSPITAL_COMMUNITY): Admission: RE | Admit: 2015-10-03 | Payer: No Typology Code available for payment source | Source: Ambulatory Visit

## 2015-10-13 ENCOUNTER — Other Ambulatory Visit: Payer: Self-pay | Admitting: Internal Medicine

## 2015-10-13 MED FILL — LOSARTAN POTASSIUM 100 MG T: 100 | 30 days supply | Qty: 30 | Fill #5

## 2015-10-14 MED FILL — ISOSORBIDE MN ER 60 MG TAB: 60 | 30 days supply | Qty: 30 | Fill #0

## 2015-10-14 MED FILL — HYDROCHLOROTHIAZIDE 25 MG T: 25 | 30 days supply | Qty: 30 | Fill #0

## 2015-11-09 NOTE — Progress Notes (Signed)
      HPI: FU hypertension. Nuclear study 7/15 showed EF 57 and no ischemia or infarction. Echocardiogram and abdominal ultrasound ordered at last office visit but not performed. Since last seen,   Current Outpatient Prescriptions  Medication Sig Dispense Refill  . amLODipine (NORVASC) 5 MG tablet Take 1 tablet (5 mg total) by mouth daily. 180 tablet 3  . aspirin 81 MG tablet Take 1 tablet (81 mg total) by mouth daily. 30 tablet 3  . carvedilol (COREG) 12.5 MG tablet Take 1 tablet (12.5 mg total) by mouth 2 (two) times daily with a meal. 180 tablet 3  . hydrochlorothiazide (HYDRODIURIL) 25 MG tablet Take 1 tablet (25 mg total) by mouth daily. 30 tablet 2  . isosorbide mononitrate (IMDUR) 60 MG 24 hr tablet Take 1 tablet (60 mg total) by mouth daily. 30 tablet 2  . losartan (COZAAR) 100 MG tablet Take 1 tablet (100 mg total) by mouth daily. 30 tablet 5  . meloxicam (MOBIC) 7.5 MG tablet Take one by mouth twice daily for hip pain 60 tablet 1  . traMADol (ULTRAM) 50 MG tablet Take 1 tablet (50 mg total) by mouth every 12 (twelve) hours as needed. 30 tablet 0   No current facility-administered medications for this visit.     Past Medical History  Diagnosis Date  . Hypertension   . TIA (transient ischemic attack) 04/20/12  . Chest pain     a. Normal stress test 02/2014.  . Tobacco abuse   . Obesity     Past Surgical History  Procedure Laterality Date  . Cesarean section    . 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 Marital Status: Married    Spouse Name: N/A  . Number of Children: N/A  . Years of Education: N/A   Occupational History  . Not on file.   Social History Main Topics  . Smoking status: Current Every Day Smoker -- 0.25 packs/day    Types: Cigarettes  . Smokeless tobacco: Not on file     Comment: Smoking 2 cigs per day  . Alcohol Use: 0.6 oz/week    1 Standard drinks or equivalent per week  . Drug Use: No  . Sexual  Activity: Not on file   Other Topics Concern  . Not on file   Social History Narrative    Family History  Problem Relation Age of Onset  . Heart disease Mother   . Hypertension Mother   . Cancer Father     ROS: no fevers or chills, productive cough, hemoptysis, dysphasia, odynophagia, melena, hematochezia, dysuria, hematuria, rash, seizure activity, orthopnea, PND, pedal edema, claudication. Remaining systems are negative.  Physical Exam: Well-developed well-nourished in no acute distress.  Skin is warm and dry.  HEENT is normal.  Neck is supple.  Chest is clear to auscultation with normal expansion.  Cardiovascular exam is regular rate and rhythm.  Abdominal exam nontender or distended. No masses palpated. Extremities show no edema. neuro grossly intact  ECG     This encounter was created in error - please disregard.

## 2015-11-15 ENCOUNTER — Encounter: Payer: No Typology Code available for payment source | Admitting: Cardiology

## 2015-11-17 ENCOUNTER — Encounter: Payer: Self-pay | Admitting: *Deleted

## 2015-11-30 ENCOUNTER — Other Ambulatory Visit: Payer: Self-pay | Admitting: Internal Medicine

## 2015-11-30 MED FILL — ?CARVEDILOL 12.5 MG TABLET: 12.5 | 30 days supply | Qty: 60 | Fill #1

## 2015-11-30 MED FILL — LOSARTAN POTASSIUM 100 MG T: 100 | 30 days supply | Qty: 30 | Fill #0

## 2015-11-30 MED FILL — ?AMLODIPINE BESYLATE 5 MG T: 5 | 30 days supply | Qty: 30 | Fill #1

## 2015-11-30 MED FILL — ?HYDROCHLOROTHIAZIDE 25 MG: 25 MG | 30 days supply | Qty: 30 | Fill #1

## 2015-11-30 MED FILL — ISOSORBIDE MN ER 60 MG TAB: 60 | 30 days supply | Qty: 30 | Fill #1

## 2015-11-30 MED FILL — MELOXICAM 7.5 MG TABLET: 7.5 | 30 days supply | Qty: 60 | Fill #1

## 2015-12-01 ENCOUNTER — Telehealth: Payer: Self-pay | Admitting: Internal Medicine

## 2015-12-01 NOTE — Telephone Encounter (Signed)
Pt. Came into facility requesting to speak to the nurse because she is loosing weight rapidly and she stomach has been acting up. Please f/u with pt.

## 2015-12-15 ENCOUNTER — Emergency Department (HOSPITAL_COMMUNITY): Payer: Self-pay

## 2015-12-15 ENCOUNTER — Emergency Department (HOSPITAL_COMMUNITY)
Admission: EM | Admit: 2015-12-15 | Discharge: 2015-12-15 | Disposition: A | Discharge status: Home / Self Care | Payer: Self-pay | Attending: Emergency Medicine | Admitting: Emergency Medicine

## 2015-12-15 ENCOUNTER — Encounter (HOSPITAL_COMMUNITY): Payer: Self-pay | Admitting: Emergency Medicine

## 2015-12-15 DIAGNOSIS — Z8673 Personal history of transient ischemic attack (TIA), and cerebral infarction without residual deficits: Secondary | ICD-10-CM | POA: Insufficient documentation

## 2015-12-15 DIAGNOSIS — Z7982 Long term (current) use of aspirin: Secondary | ICD-10-CM | POA: Insufficient documentation

## 2015-12-15 DIAGNOSIS — I951 Orthostatic hypotension: Secondary | ICD-10-CM | POA: Insufficient documentation

## 2015-12-15 DIAGNOSIS — F17210 Nicotine dependence, cigarettes, uncomplicated: Secondary | ICD-10-CM | POA: Insufficient documentation

## 2015-12-15 DIAGNOSIS — I10 Essential (primary) hypertension: Secondary | ICD-10-CM | POA: Insufficient documentation

## 2015-12-15 DIAGNOSIS — E669 Obesity, unspecified: Secondary | ICD-10-CM | POA: Insufficient documentation

## 2015-12-15 DIAGNOSIS — Z79899 Other long term (current) drug therapy: Secondary | ICD-10-CM | POA: Insufficient documentation

## 2015-12-15 DIAGNOSIS — R0789 Other chest pain: Secondary | ICD-10-CM | POA: Insufficient documentation

## 2015-12-15 DIAGNOSIS — E876 Hypokalemia: Secondary | ICD-10-CM | POA: Insufficient documentation

## 2015-12-15 DIAGNOSIS — Z880 Allergy status to penicillin: Secondary | ICD-10-CM | POA: Insufficient documentation

## 2015-12-15 DIAGNOSIS — E860 Dehydration: Secondary | ICD-10-CM | POA: Insufficient documentation

## 2015-12-15 LAB — I-STAT CHEM 8, ED
BUN: 20 mg/dL (ref 6–20)
BUN: 20 mg/dL (ref 6–20)
CALCIUM ION: 1.01 mmol/L — AB (ref 1.12–1.23)
CALCIUM ION: 1.08 mmol/L — AB (ref 1.12–1.23)
CHLORIDE: 97 mmol/L — AB (ref 101–111)
Chloride: 102 mmol/L (ref 101–111)
Creatinine, Ser: 1.5 mg/dL — ABNORMAL HIGH (ref 0.44–1.00)
Creatinine, Ser: 1.5 mg/dL — ABNORMAL HIGH (ref 0.44–1.00)
GLUCOSE: 81 mg/dL (ref 65–99)
Glucose, Bld: 95 mg/dL (ref 65–99)
HCT: 48 % — ABNORMAL HIGH (ref 36.0–46.0)
HCT: 48 % — ABNORMAL HIGH (ref 36.0–46.0)
HEMOGLOBIN: 16.3 g/dL — AB (ref 12.0–15.0)
Hemoglobin: 16.3 g/dL — ABNORMAL HIGH (ref 12.0–15.0)
Potassium: 2.8 mmol/L — ABNORMAL LOW (ref 3.5–5.1)
Potassium: 3.3 mmol/L — ABNORMAL LOW (ref 3.5–5.1)
SODIUM: 142 mmol/L (ref 135–145)
SODIUM: 139 mmol/L (ref 135–145)
TCO2: 25 mmol/L (ref 0–100)
TCO2: 27 mmol/L (ref 0–100)

## 2015-12-15 LAB — CBC WITH DIFFERENTIAL/PLATELET
Basophils Absolute: 0 10*3/uL (ref 0.0–0.1)
Basophils Relative: 0 %
EOS ABS: 0.1 10*3/uL (ref 0.0–0.7)
EOS PCT: 2 %
HCT: 45.4 % (ref 36.0–46.0)
Hemoglobin: 14.9 g/dL (ref 12.0–15.0)
LYMPHS ABS: 1.9 10*3/uL (ref 0.7–4.0)
LYMPHS PCT: 47 %
MCH: 34.8 pg — AB (ref 26.0–34.0)
MCHC: 32.8 g/dL (ref 30.0–36.0)
MCV: 106.1 fL — AB (ref 78.0–100.0)
MONOS PCT: 6 %
Monocytes Absolute: 0.3 10*3/uL (ref 0.1–1.0)
Neutro Abs: 1.8 10*3/uL (ref 1.7–7.7)
Neutrophils Relative %: 45 %
PLATELETS: 138 10*3/uL — AB (ref 150–400)
RBC: 4.28 MIL/uL (ref 3.87–5.11)
RDW: 13.6 % (ref 11.5–15.5)
WBC: 4.1 10*3/uL (ref 4.0–10.5)

## 2015-12-15 LAB — URINALYSIS, ROUTINE W REFLEX MICROSCOPIC
Bilirubin Urine: NEGATIVE
GLUCOSE, UA: NEGATIVE mg/dL
HGB URINE DIPSTICK: NEGATIVE
Ketones, ur: 15 mg/dL — AB
LEUKOCYTES UA: NEGATIVE
Nitrite: NEGATIVE
PH: 6 (ref 5.0–8.0)
PROTEIN: 30 mg/dL — AB
Specific Gravity, Urine: 1.016 (ref 1.005–1.030)

## 2015-12-15 LAB — I-STAT TROPONIN, ED
TROPONIN I, POC: 0.01 ng/mL (ref 0.00–0.08)
TROPONIN I, POC: 0.01 ng/mL (ref 0.00–0.08)

## 2015-12-15 LAB — URINE MICROSCOPIC-ADD ON

## 2015-12-15 MED ORDER — POTASSIUM CHLORIDE 10 MEQ/100ML IV SOLN
10.0000 meq | Freq: Once | INTRAVENOUS | Status: AC
  Administered 2015-12-15: 10 meq via INTRAVENOUS
  Filled 2015-12-15: qty 100

## 2015-12-15 MED ORDER — ONDANSETRON HCL 4 MG PO TABS: 4.0000 mg | ORAL_TABLET | Freq: Three times a day (TID) | ORAL | Status: DC | PRN

## 2015-12-15 MED ORDER — POTASSIUM CHLORIDE CRYS ER 20 MEQ PO TBCR: 20.0000 meq | EXTENDED_RELEASE_TABLET | Freq: Every day | ORAL | Status: DC

## 2015-12-15 MED ORDER — SODIUM CHLORIDE 0.9 % IV BOLUS (SEPSIS)
1000.0000 mL | Freq: Once | INTRAVENOUS | Status: AC
  Administered 2015-12-15: 1000 mL via INTRAVENOUS

## 2015-12-15 MED ORDER — POTASSIUM CHLORIDE CRYS ER 20 MEQ PO TBCR
40.0000 meq | EXTENDED_RELEASE_TABLET | Freq: Once | ORAL | Status: AC
  Administered 2015-12-15: 40 meq via ORAL
  Filled 2015-12-15: qty 2

## 2015-12-15 NOTE — ED Notes (Signed)
Pt arrives with c/o central chest pain that began at work around 0630. Pain is described as dull, non-radiating 7/10 at worst. Pain intermittent with occasional L arm numbness/pain. Pt works in [employer] and lifts heavy items. Afebrile, denies sob or n/v presently, a&ox4.

## 2015-12-15 NOTE — Discharge Instructions (Signed)
Dehydration, Adult Dehydration means your body does not have as much fluid or water as it needs. It happens when you take in less fluid than you lose. Your kidneys, brain, and heart will not work properly without the right amount of fluids.  Dehydration can range from mild to severe. It should be treated right away to help prevent it from becoming severe. HOME CARE  Drink enough fluid to keep your pee (urine) clear or pale yellow.  Drink water or fluid slowly by taking small sips. You can also try sucking on ice cubes.  Have food or drinks that contain electrolytes. Examples include bananas and sports drinks.  Take over-the-counter and prescription medicines only as told by your doctor.  Prepare oral rehydration solution (ORS) according to the instructions that came with it. Take sips of ORS every 5 minutes until your pee returns to normal.  If you are throwing up (vomiting) or have watery poop (diarrhea), keep trying to drink water, ORS, or both.  If you have watery poop, avoid:  Drinks with caffeine.  Fruit juice.  Milk.  Carbonated soft drinks.  Do not take salt tablets. This can lead to having too much sodium in your body (hypernatremia). GET HELP IF:  You cannot eat or drink without throwing up.  You have had mild watery poop for longer than 24 hours.  You have a fever. GET HELP RIGHT AWAY IF:   You have very strong thirst.  You have very bad watery poop.  You have not peed in 6-8 hours, or you have peed only a small amount of very dark pee.  You have shriveled skin.  You are dizzy, confused, or both.   This information is not intended to replace advice given to you by your health care provider. Make sure you discuss any questions you have with your health care provider.   Document Released: 06/02/2009 Document Revised: 04/27/2015 Document Reviewed: 12/22/2014 Elsevier Interactive Patient Education 2016 [employer] Hypokalemia Hypokalemia means that the  amount of potassium in the blood is lower than normal.Potassium is a chemical, called an electrolyte, that helps regulate the amount of fluid in the body. It also stimulates muscle contraction and helps nerves function properly.Most of the body's potassium is inside of cells, and only a very small amount is in the blood. Because the amount in the blood is so small, minor changes can be life-threatening. CAUSES Antibiotics. Diarrhea or vomiting. Using laxatives too much, which can cause diarrhea. Chronic kidney disease. Water pills (diuretics). Eating disorders (bulimia). Low magnesium level. Sweating a lot. SIGNS AND SYMPTOMS Weakness. Constipation. Fatigue. Muscle cramps. Mental confusion. Skipped heartbeats or irregular heartbeat (palpitations). Tingling or numbness. DIAGNOSIS  Your health care provider can diagnose hypokalemia with blood tests. In addition to checking your potassium level, your health care provider may also check other lab tests. TREATMENT Hypokalemia can be treated with potassium supplements taken by mouth or adjustments in your current medicines. If your potassium level is very low, you may need to get potassium through a vein (IV) and be monitored in the hospital. A diet high in potassium is also helpful. Foods high in potassium are: Nuts, such as peanuts and pistachios. Seeds, such as sunflower seeds and pumpkin seeds. Peas, lentils, and lima beans. Whole grain and bran cereals and breads. Fresh fruit and vegetables, such as apricots, avocado, bananas, cantaloupe, kiwi, oranges, tomatoes, asparagus, and potatoes. Orange and tomato juices. Red meats. Fruit yogurt. HOME CARE INSTRUCTIONS Take all medicines as prescribed by your   health care provider. Maintain a healthy diet by including nutritious food, such as fruits, vegetables, nuts, whole grains, and lean meats. If you are taking a laxative, be sure to follow the directions on the label. SEEK MEDICAL CARE  IF: Your weakness gets worse. You feel your heart pounding or racing. You are vomiting or having diarrhea. You are diabetic and having trouble keeping your blood glucose in the normal range. SEEK IMMEDIATE MEDICAL CARE IF: You have chest pain, shortness of breath, or dizziness. You are vomiting or having diarrhea for more than 2 days. You faint. MAKE SURE YOU:  Understand these instructions. Will watch your condition. Will get help right away if you are not doing well or get worse.   This information is not intended to replace advice given to you by your health care provider. Make sure you discuss any questions you have with your health care provider.   Document Released: 08/06/2005 Document Revised: 08/27/2014 Document Reviewed: 02/06/2013 Elsevier Interactive Patient Education 2016 Elsevier Inc.  

## 2015-12-15 NOTE — ED Notes (Signed)
Walked with Ms. Veronica Turner to the bathroom. Pt stated she felt a lot better standing and was not dizzy. Pt was steady and while walking said she was feeling better. Did not have to walk with any assistance.

## 2015-12-15 NOTE — ED Provider Notes (Signed)
CSN: [id number]     Arrival date & time 12/15/15  0804 History   First MD Initiated Contact with Patient 12/15/15 [phone]     No chief complaint on file.    (Consider location/radiation/quality/duration/timing/severity/associated sxs/prior Treatment) HPI   55 year old obese female with history of TIA, recurrent chest pain with normal stress tests in 2015, hypertension, tobacco abuse presenting with complaints of chest pain. Patient states ever since her daughter was diagnosed with a stroke over a month ago, patient has had intermittent bouts of chest pain and increased stress level. In the month of April she has had 3 separate episodes of chest pain which she described as "someone kicking my chest" which lasted for less than 30 minutes and usually is resolved without any specific treatment. Today while at work she had another chest discomfort approximately 3 hours ago. She described the pain as 7 out of 10 dull achy pain on her left chest with tingling numbness sensation throughout the left arm which is new. During the pain she also experiencing lightheadedness, blurred vision, dizziness, nauseous and felt clammy. Incident happened approximately 20 minutes and has since resolved. She is wondering if her symptom may be related to stress. She also works as a Nurse, learning disability heavy items but states she is right-hand dominant. She denies any recent injury to the left arm. Patient states she had a GI bug the past several days has decreased appetite with occasional nausea vomiting diarrhea. Patient does take a baby aspirin daily. She had a cardiac stress test done in 2015 was normal. She does smoke 2 cigarettes daily. She drinks alcohol socially. She does have a significant family history of cardiac disease. Otherwise patient denies fever, URI symptoms, abdominal pain, dysuria, or rash. She denies on weakness. She did report a mild headache earlier in the day which she took Tylenol and that has resolved her  headache.  Past Medical History  Diagnosis Date  . Hypertension   . TIA (transient ischemic attack) 04/20/12  . Chest pain     a. Normal stress test 02/2014.  . Tobacco abuse   . Obesity    Past Surgical History  Procedure Laterality Date  . Cesarean section    . 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 Heart disease Mother   . Hypertension Mother   . Cancer Father    Social History  Substance Use Topics  . Smoking status: Current Every Day Smoker -- 0.25 packs/day    Types: Cigarettes  . Smokeless tobacco: Not on file     Comment: Smoking 2 cigs per day  . Alcohol Use: 0.6 oz/week    1 Standard drinks or equivalent per week   OB History    Gravida Para Term Preterm AB TAB SAB Ectopic Multiple Living   0 2 0 0 5     Review of Systems  All other systems reviewed and are negative.     Allergies  Penicillins  Home Medications   Prior to Admission medications   Medication Sig Start Date End Date Taking? Authorizing Provider  amLODipine (NORVASC) 5 MG tablet Take 1 tablet (5 mg total) by mouth daily. 09/19/15   Lewayne Bunting, MD  aspirin 81 MG tablet Take 1 tablet (81 mg total) by mouth daily. 07/01/14   Ambrose Finland, NP  carvedilol (COREG) 12.5 MG tablet Take 1 tablet (12.5 mg  total) by mouth 2 (two) times daily with a meal. 09/19/15   Lewayne BuntingBrian S Crenshaw, MD  hydrochlorothiazide (HYDRODIURIL) 25 MG tablet Take 1 tablet (25 mg total) by mouth daily. 10/14/15   Ambrose FinlandValerie A Keck, NP  isosorbide mononitrate (IMDUR) 60 MG 24 hr tablet Take 1 tablet (60 mg total) by mouth daily. 10/14/15   Ambrose FinlandValerie A Keck, NP  losartan (COZAAR) 100 MG tablet TAKE 1 TABLET BY MOUTH DAILY 11/30/15   Ambrose FinlandValerie A Keck, NP  meloxicam (MOBIC) 7.5 MG tablet Take one by mouth twice daily for hip pain 09/16/15   Nestor RampSara L Neal, MD  traMADol (ULTRAM) 50 MG tablet Take 1 tablet (50 mg total) by mouth every 12 (twelve) hours as needed. 09/01/15   Ambrose FinlandValerie  A Keck, NP   LMP 07/01/2014 Physical Exam  Constitutional: She is oriented to person, place, and time. She appears well-developed and well-nourished. No distress.  African-American female laying in bed in no acute discomfort and nontoxic.  HENT:  Head: Atraumatic.  Right Ear: External ear normal.  Left Ear: External ear normal.  Mouth/Throat: Oropharynx is clear and moist.  Eyes: Conjunctivae and EOM are normal. Pupils are equal, round, and reactive to light.  Neck: Normal range of motion. Neck supple. No JVD present.  No nuchal rigidity  Cardiovascular: Normal rate and regular rhythm.   Pulmonary/Chest: Effort normal and breath sounds normal. No respiratory distress. She has no wheezes.  Abdominal: Soft. Bowel sounds are normal. There is no tenderness.  Musculoskeletal: Normal range of motion. She exhibits no edema or tenderness.  5/5 strength to all 4 extremities  Lymphadenopathy:    She has no cervical adenopathy.  Neurological: She is alert and oriented to person, place, and time.  Neurologic exam:  Speech clear, pupils equal round reactive to light, extraocular movements intact  Normal peripheral visual fields Cranial nerves III through XII normal including no facial droop Follows commands, moves all extremities x4, normal strength to bilateral upper and lower extremities at all major muscle groups including grip Sensation normal to light touch  Coordination intact, no limb ataxia, finger-nose-finger normal Rapid alternating movements normal No pronator drift Gait normal   Skin: No rash noted.  Psychiatric: She has a normal mood and affect.  Nursing note and vitals reviewed.   ED Course  Procedures (including critical care time) Labs Review Labs Reviewed  CBC WITH DIFFERENTIAL/PLATELET - Abnormal; Notable for the following:    MCV 106.1 (*)    MCH 34.8 (*)    Platelets 138 (*)    All other components within normal limits  URINALYSIS, ROUTINE W REFLEX MICROSCOPIC  (NOT AT [facility]) - Abnormal; Notable for the following:    Color, Urine AMBER (*)    APPearance CLOUDY (*)    Ketones, ur 15 (*)    Protein, ur 30 (*)    All other components within normal limits  URINE MICROSCOPIC-ADD ON - Abnormal; Notable for the following:    Squamous Epithelial / LPF 6-30 (*)    Bacteria, UA FEW (*)    Casts HYALINE CASTS (*)    All other components within normal limits  I-STAT CHEM 8, ED - Abnormal; Notable for the following:    Potassium 2.8 (*)    Creatinine, Ser 1.50 (*)    Calcium, Ion 1.01 (*)    Hemoglobin 16.3 (*)    HCT 48.0 (*)    All other components within normal limits  I-STAT CHEM 8, ED - Abnormal; Notable for the following:  Potassium 3.3 (*)    Chloride 97 (*)    Creatinine, Ser 1.50 (*)    Calcium, Ion 1.08 (*)    Hemoglobin 16.3 (*)    HCT 48.0 (*)    All other components within normal limits  I-STAT TROPOININ, ED  Rosezena Sensor, ED    Imaging Review Dg Chest 2 View  12/15/2015  CLINICAL DATA:  Chest pain. EXAM: CHEST  2 VIEW COMPARISON:  February 19, 2014. FINDINGS: The heart size and mediastinal contours are within normal limits. Both lungs are clear. No pneumothorax or pleural effusion is noted. The visualized skeletal structures are unremarkable. IMPRESSION: No active cardiopulmonary disease. Electronically Signed   By: Lupita Raider, M.D.   On: 12/15/2015 09:32   I have personally reviewed and evaluated these images and lab results as part of my medical decision-making.   EKG Interpretation   Date/Time:  Thursday December 15 2015 08:09:55 EDT Ventricular Rate:  83 PR Interval:  134 QRS Duration: 86 QT Interval:  266 QTC Calculation: 312 R Axis:   76 Text Interpretation:  Normal sinus rhythm Right atrial enlargement Minimal  voltage criteria for LVH, may be normal variant Nonspecific T wave  abnormality Abnormal ECG Possible u wave or new deflection on ekg  Confirmed by NGUYEN, EMILY ([id number]) on 12/15/2015 8:19:29 AM        Date: 12/15/2015 @ 11:33  Rate: 63  Rhythm: normal sinus rhythm  QRS Axis: normal  Intervals: normal  ST/T Wave abnormalities: normal  Conduction Disutrbances: none  Narrative Interpretation:   Old EKG Reviewed: resolution of U-wave     MDM   Final diagnoses:  Orthostatic hypotension  Dehydration  Hypokalemia  Other chest pain    BP 131/79 mmHg  Pulse 79  Temp(Src) 98.1 F (36.7 C) (Oral)  Resp 23  SpO2 96%  LMP 07/01/2014   8:49 AM Patient complaining of chest pain with left arm tingling sensation. The chest pain is less likely to be ACS giving that she had a normal cardiac stress test 2 years ago for the same types of pain. This may be attributed to her ongoing family stress. Cardiac workup initiated including delta troponin.  Patient report left arm tingling sensation briefly. Was documented that she has history of TIA. She has no focal neuro deficit on exam and no appreciable weakness of the left arm. She has had a lower GI and has decrease in appetite. The symptoms may be related to orthostasis, will hydrate with IV fluid and will check orthostatic vital sign. Workup initiated. Doubt stroke.    9:31 AM EKG demonstrates evidence of U waves concerning for hypokalemia. Her potassium is 2.8, she is hemoconcentrated with a hemoglobin of 16.3. Evidence of AKI with a creatinine of 1.5. She does not have any active chest pain and her troponin is negative. Potassium supplementation given. Will admit for obs.    9:46 AM I did discussed option of admission.  However, pt prefers to be hydrated and potassium replenishment.  Will obtain delta troponin, and repeat another EKG.  Care discussed with Dr. Cyndie Chime.    11:38 AM U wave resolved on repeat EKG after replenishment of K+.    12:34 PM Patient felt much better, improves orthostatic vital signs on recheck, everyday without difficulty, and able to tolerates by mouth. She feels comfortable going home. She does not have any active  chest pain and her delta troponin is negative. Improvement of her EKG on repeat. Potassium supplementation and antinausea medication will  be prescribed. She will take the next several days of rest. She will also follow-up with her primary care provider for further care. Return precaution discussed.  Fayrene Helper, PA-C 12/15/15 1239  Leta Baptist, MD 12/29/15 [phone]

## 2016-01-24 MED FILL — ISOSORBIDE MN ER 60 MG TAB: 60 | 30 days supply | Qty: 30 | Fill #2

## 2016-01-27 ENCOUNTER — Ambulatory Visit: Payer: No Typology Code available for payment source | Attending: Family Medicine | Admitting: Family Medicine

## 2016-01-27 ENCOUNTER — Encounter: Payer: Self-pay | Admitting: Family Medicine

## 2016-01-27 VITALS — BP 134/87 | HR 86 | Temp 98.3°F | Ht 64.0 in | Wt 166.4 lb

## 2016-01-27 DIAGNOSIS — R195 Other fecal abnormalities: Secondary | ICD-10-CM

## 2016-01-27 DIAGNOSIS — I10 Essential (primary) hypertension: Secondary | ICD-10-CM

## 2016-01-27 DIAGNOSIS — R630 Anorexia: Secondary | ICD-10-CM

## 2016-01-27 DIAGNOSIS — E876 Hypokalemia: Secondary | ICD-10-CM

## 2016-01-27 MED ORDER — LOSARTAN POTASSIUM 100 MG PO TABS: 100.0000 mg | ORAL_TABLET | Freq: Every day | ORAL | Status: DC

## 2016-01-27 MED ORDER — HYDROCHLOROTHIAZIDE 25 MG PO TABS: 25.0000 mg | ORAL_TABLET | Freq: Every day | ORAL | Status: DC

## 2016-01-27 MED ORDER — MELOXICAM 7.5 MG PO TABS: ORAL_TABLET | ORAL | Status: DC

## 2016-01-27 MED ORDER — ISOSORBIDE MONONITRATE ER 60 MG PO TB24: 60.0000 mg | ORAL_TABLET | Freq: Every day | ORAL | Status: DC

## 2016-01-27 NOTE — Progress Notes (Signed)
Subjective:  Patient ID: Veronica Turner, female    DOB: October 09, 1960  Age: 55 y.o. MRN: [id number]  CC: Establishing Care, transferred from Ms. Luna Glasgow   HPI Veronica Turner is a 55 year old female with a history of hypertension previously followed by the nurse practitioner who comes to establish care with me.  She has been compliant with all her medications and low-sodium diet. She is concerned about a one-month history of reduced appetite and soft bowel movements but denies diarrhea; she notes that she had to switch from 2% milk to Lactaid milk with resulting improvement in abdominal symptoms. Diet consists of oatmeal and other foods high in fiber which she states she has to take to prevent himself from getting constipated. Denies weight loss and has never had a colonoscopy.  Denies chest pain or shortness of breath.  Outpatient Prescriptions Prior to Visit  Medication Sig Dispense Refill  . amLODipine (NORVASC) 5 MG tablet Take 1 tablet (5 mg total) by mouth daily. 180 tablet 3  . aspirin 81 MG tablet Take 1 tablet (81 mg total) by mouth daily. 30 tablet 3  . carvedilol (COREG) 12.5 MG tablet Take 1 tablet (12.5 mg total) by mouth 2 (two) times daily with a meal. 180 tablet 3  . hydrochlorothiazide (HYDRODIURIL) 25 MG tablet Take 1 tablet (25 mg total) by mouth daily. 30 tablet 2  . isosorbide mononitrate (IMDUR) 60 MG 24 hr tablet Take 1 tablet (60 mg total) by mouth daily. 30 tablet 2  . losartan (COZAAR) 100 MG tablet TAKE 1 TABLET BY MOUTH DAILY 30 tablet 2  . meloxicam (MOBIC) 7.5 MG tablet Take one by mouth twice daily for hip pain 60 tablet 1  . ondansetron (ZOFRAN) 4 MG tablet Take 1 tablet (4 mg total) by mouth every 8 (eight) hours as needed for nausea or vomiting. (Patient not taking: Reported on 01/27/2016) 12 tablet 0  . potassium chloride SA (K-DUR,KLOR-CON) 20 MEQ tablet Take 1 tablet (20 mEq total) by mouth daily. (Patient not taking: Reported on 01/27/2016) 3 tablet 0  .  traMADol (ULTRAM) 50 MG tablet Take 1 tablet (50 mg total) by mouth every 12 (twelve) hours as needed. (Patient not taking: Reported on 12/15/2015) 30 tablet 0   No facility-administered medications prior to visit.    ROS Review of Systems  Constitutional: Positive for appetite change. Negative for activity change and fatigue.  HENT: Negative for congestion, sinus pressure and sore throat.   Eyes: Negative for visual disturbance.  Respiratory: Negative for cough, chest tightness, shortness of breath and wheezing.   Cardiovascular: Negative for chest pain and palpitations.  Gastrointestinal: Negative for abdominal pain, diarrhea (soft stools), constipation and abdominal distention.  Endocrine: Negative for polydipsia.  Genitourinary: Negative for dysuria and frequency.  Musculoskeletal: Negative for back pain and arthralgias.  Skin: Negative for rash.  Neurological: Negative for tremors, light-headedness and numbness.  Hematological: Does not bruise/bleed easily.  Psychiatric/Behavioral: Negative for behavioral problems and agitation.    Objective:  BP 134/87 mmHg  Pulse 86  Temp(Src) 98.3 F (36.8 C) (Oral)  Ht  (1.626 m)  Wt 166 lb 6.4 oz (75.479 kg)  BMI 28.55 kg/m2  SpO2 95%  LMP 07/01/2014  BP/Weight 01/27/2016 12/15/2015 09/19/2015  Systolic BP 134 123 200  Diastolic BP 87 74 120  Wt. (Lbs) 166.4 - 169.7  BMI 28.55 - 29.11      Physical Exam  Constitutional: She is oriented to person, place, and time. She appears well-developed and well-nourished.  Cardiovascular: Normal rate, normal heart sounds and intact distal pulses.   No murmur heard. Pulmonary/Chest: Effort normal and breath sounds normal. She has no wheezes. She has no rales. She exhibits no tenderness.  Abdominal: Soft. Bowel sounds are normal. She exhibits mass (not tender to palpation and reducible). She exhibits no distension. There is no tenderness. Guarding: ventral hernia.  Musculoskeletal: Normal  range of motion.  Neurological: She is alert and oriented to person, place, and time.     CMP Latest Ref Rng 12/15/2015 12/15/2015 02/03/2015  Glucose 65 - 99 mg/dL 95 81 94  BUN 6 - 20 mg/dL 20 20 13   Creatinine 0.44 - 1.00 mg/dL 8.11(B1.50(H) 1.47(W1.50(H) 2.950.84  Sodium 135 - 145 mmol/L 139 142 137  Potassium 3.5 - 5.1 mmol/L 3.3(L) 2.8(L) 3.4(L)  Chloride 101 - 111 mmol/L 97(L) 102 96  CO2 19 - 32 mEq/L - - 29  Calcium 8.4 - 10.5 mg/dL - - 9.5  Total Protein 6.0 - 8.3 g/dL - - 7.1  Total Bilirubin 0.2 - 1.2 mg/dL - - 0.8  Alkaline Phos 39 - 117 U/L - - 92  AST 0 - 37 U/L - - 30  ALT 0 - 35 U/L - - 21     Lipid Panel     Component Value Date/Time   CHOL 144 02/20/2014 0306   TRIG 46 02/20/2014 0306   HDL 33* 02/20/2014 0306   CHOLHDL 4.4 02/20/2014 0306   VLDL 9 02/20/2014 0306   LDLCALC 102* 02/20/2014 0306       Assessment & Plan:   1. Essential hypertension Controlled Low-sodium diet - hydrochlorothiazide (HYDRODIURIL) 25 MG tablet; Take 1 tablet (25 mg total) by mouth daily.  Dispense: 30 tablet; Refill: 3 - isosorbide mononitrate (IMDUR) 60 MG 24 hr tablet; Take 1 tablet (60 mg total) by mouth daily.  Dispense: 30 tablet; Refill: 3 - losartan (COZAAR) 100 MG tablet; Take 1 tablet (100 mg total) by mouth daily.  Dispense: 30 tablet; Refill: 3 - COMPLETE METABOLIC PANEL WITH GFR; Future - Lipid panel; Future  2. Abnormal stools Discussed elimination diet with the patient as she could be lactose intolerant She could also cut back on her fiber intake  3. Appetite loss Explained that this could be due to age and the fact that she is not as active as when she was younger Would love to order colonoscopy which will be performed at her next office visit.  4. Hypokalemia She has not been compliant with her potassium pills even though last potassium was 3.3. She has been taking bananas because she did not want to take too many pills. We will resume potassium is still  hypokalemic We have discussed discontinuing HCTZ as she states she needs this due to dependent pedal edema  Meds ordered this encounter  Medications  . hydrochlorothiazide (HYDRODIURIL) 25 MG tablet    Sig: Take 1 tablet (25 mg total) by mouth daily.    Dispense:  30 tablet    Refill:  3  . isosorbide mononitrate (IMDUR) 60 MG 24 hr tablet    Sig: Take 1 tablet (60 mg total) by mouth daily.    Dispense:  30 tablet    Refill:  3  . losartan (COZAAR) 100 MG tablet    Sig: Take 1 tablet (100 mg total) by mouth daily.    Dispense:  30 tablet    Refill:  3  . meloxicam (MOBIC) 7.5 MG tablet    Sig: Take one by mouth  twice daily for hip pain    Dispense:  60 tablet    Refill:  1    Follow-up: Return in about 1 month (around 02/26/2016) for Complete physical exam.   Jaclyn Shaggy MD

## 2016-01-27 NOTE — Patient Instructions (Signed)
Hypertension Hypertension, commonly called high blood pressure, is when the force of blood pumping through your arteries is too strong. Your arteries are the blood vessels that carry blood from your heart throughout your body. A blood pressure reading consists of a higher number over a lower number, such as 110/72. The higher number (systolic) is the pressure inside your arteries when your heart pumps. The lower number (diastolic) is the pressure inside your arteries when your heart relaxes. Ideally you want your blood pressure below 120/80. Hypertension forces your heart to work harder to pump blood. Your arteries may become narrow or stiff. Having untreated or uncontrolled hypertension can cause heart attack, stroke, kidney disease, and other problems. RISK FACTORS Some risk factors for high blood pressure are controllable. Others are not.  Risk factors you cannot control include:   Race. You may be at higher risk if you are African American.  Age. Risk increases with age.  Gender. Men are at higher risk than women before age 45 years. After age 65, women are at higher risk than men. Risk factors you can control include:  Not getting enough exercise or physical activity.  Being overweight.  Getting too much fat, sugar, calories, or salt in your diet.  Drinking too much alcohol. SIGNS AND SYMPTOMS Hypertension does not usually cause signs or symptoms. Extremely high blood pressure (hypertensive crisis) may cause headache, anxiety, shortness of breath, and nosebleed. DIAGNOSIS To check if you have hypertension, your health care provider will measure your blood pressure while you are seated, with your arm held at the level of your heart. It should be measured at least twice using the same arm. Certain conditions can cause a difference in blood pressure between your right and left arms. A blood pressure reading that is higher than normal on one occasion does not mean that you need treatment. If  it is not clear whether you have high blood pressure, you may be asked to return on a different day to have your blood pressure checked again. Or, you may be asked to monitor your blood pressure at home for 1 or more weeks. TREATMENT Treating high blood pressure includes making lifestyle changes and possibly taking medicine. Living a healthy lifestyle can help lower high blood pressure. You may need to change some of your habits. Lifestyle changes may include:  Following the DASH diet. This diet is high in fruits, vegetables, and whole grains. It is low in salt, red meat, and added sugars.  Keep your sodium intake below 2,300 mg per day.  Getting at least 30-45 minutes of aerobic exercise at least 4 times per week.  Losing weight if necessary.  Not smoking.  Limiting alcoholic beverages.  Learning ways to reduce stress. Your health care provider may prescribe medicine if lifestyle changes are not enough to get your blood pressure under control, and if one of the following is true:  You are 18-59 years of age and your systolic blood pressure is above 140.  You are 60 years of age or older, and your systolic blood pressure is above 150.  Your diastolic blood pressure is above 90.  You have diabetes, and your systolic blood pressure is over 140 or your diastolic blood pressure is over 90.  You have kidney disease and your blood pressure is above 140/90.  You have heart disease and your blood pressure is above 140/90. Your personal target blood pressure may vary depending on your medical conditions, your age, and other factors. HOME CARE INSTRUCTIONS    Have your blood pressure rechecked as directed by your health care provider.   Take medicines only as directed by your health care provider. Follow the directions carefully. Blood pressure medicines must be taken as prescribed. The medicine does not work as well when you skip doses. Skipping doses also puts you at risk for  problems.  Do not smoke.   Monitor your blood pressure at home as directed by your health care provider. SEEK MEDICAL CARE IF:   You think you are having a reaction to medicines taken.  You have recurrent headaches or feel dizzy.  You have swelling in your ankles.  You have trouble with your vision. SEEK IMMEDIATE MEDICAL CARE IF:  You develop a severe headache or confusion.  You have unusual weakness, numbness, or feel faint.  You have severe chest or abdominal pain.  You vomit repeatedly.  You have trouble breathing. MAKE SURE YOU:   Understand these instructions.  Will watch your condition.  Will get help right away if you are not doing well or get worse.   This information is not intended to replace advice given to you by your health care provider. Make sure you discuss any questions you have with your health care provider.   Document Released: 08/06/2005 Document Revised: 12/21/2014 Document Reviewed: 05/29/2013 Elsevier Interactive Patient Education 2016 [employer]  

## 2016-02-01 ENCOUNTER — Ambulatory Visit: Payer: No Typology Code available for payment source | Attending: Family Medicine

## 2016-02-08 MED FILL — ?LOSARTAN POTASSIUM 100 MG: 100 | 30 days supply | Qty: 30 | Fill #1

## 2016-02-09 NOTE — Progress Notes (Signed)
HPI: FU hypertension. Admitted 7/15 with CP. Nuclear study 7/15 showed EF 57 and no ischemia or infarction. Echocardiogram and abdominal ultrasound ordered at last ov but not performed. Since last seen,   Current Outpatient Prescriptions  Medication Sig Dispense Refill  . amLODipine (NORVASC) 5 MG tablet Take 1 tablet (5 mg total) by mouth daily. 180 tablet 3  . aspirin 81 MG tablet Take 1 tablet (81 mg total) by mouth daily. 30 tablet 3  . carvedilol (COREG) 12.5 MG tablet Take 1 tablet (12.5 mg total) by mouth 2 (two) times daily with a meal. 180 tablet 3  . hydrochlorothiazide (HYDRODIURIL) 25 MG tablet Take 1 tablet (25 mg total) by mouth daily. 30 tablet 3  . isosorbide mononitrate (IMDUR) 60 MG 24 hr tablet Take 1 tablet (60 mg total) by mouth daily. 30 tablet 3  . losartan (COZAAR) 100 MG tablet Take 1 tablet (100 mg total) by mouth daily. 30 tablet 3  . meloxicam (MOBIC) 7.5 MG tablet Take one by mouth twice daily for hip pain 60 tablet 1  . ondansetron (ZOFRAN) 4 MG tablet Take 1 tablet (4 mg total) by mouth every 8 (eight) hours as needed for nausea or vomiting. (Patient not taking: Reported on 01/27/2016) 12 tablet 0  . potassium chloride SA (K-DUR,KLOR-CON) 20 MEQ tablet Take 1 tablet (20 mEq total) by mouth daily. (Patient not taking: Reported on 01/27/2016) 3 tablet 0  . traMADol (ULTRAM) 50 MG tablet Take 1 tablet (50 mg total) by mouth every 12 (twelve) hours as needed. (Patient not taking: Reported on 12/15/2015) 30 tablet 0   No current facility-administered medications for this visit.     Past Medical History  Diagnosis Date  . Hypertension   . TIA (transient ischemic attack) 04/20/12  . Chest pain     a. Normal stress test 02/2014.  . Tobacco abuse   . Obesity     Past Surgical History  Procedure Laterality Date  . Cesarean section    . 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 Marital Status: Married    Spouse Name:  N/A  . Number of Children: N/A  . Years of Education: N/A   Occupational History  . Not on file.   Social History Main Topics  . Smoking status: Current Every Day Smoker -- 0.25 packs/day    Types: Cigarettes  . Smokeless tobacco: Not on file     Comment: Smoking 2 cigs per day  . Alcohol Use: 0.6 oz/week    1 Standard drinks or equivalent per week  . Drug Use: No  . Sexual Activity: Not on file   Other Topics Concern  . Not on file   Social History Narrative    Family History  Problem Relation Age of Onset  . Heart disease Mother   . Hypertension Mother   . Cancer Father     ROS: no fevers or chills, productive cough, hemoptysis, dysphasia, odynophagia, melena, hematochezia, dysuria, hematuria, rash, seizure activity, orthopnea, PND, pedal edema, claudication. Remaining systems are negative.  Physical Exam: Well-developed well-nourished in no acute distress.  Skin is warm and dry.  HEENT is normal.  Neck is supple.  Chest is clear to auscultation with normal expansion.  Cardiovascular exam is regular rate and rhythm.  Abdominal exam nontender or distended. No masses palpated. Extremities show no edema. neuro grossly intact  ECG  This encounter was created in error - please disregard.

## 2016-02-16 ENCOUNTER — Encounter: Payer: No Typology Code available for payment source | Admitting: Cardiology

## 2016-02-16 ENCOUNTER — Encounter: Payer: Self-pay | Admitting: *Deleted

## 2016-02-29 MED FILL — CARVEDILOL 12.5 MG TABLET: 12.5 | 30 days supply | Qty: 60 | Fill #2

## 2016-02-29 MED FILL — MELOXICAM 7.5 MG TABLET: 7.5 | 30 days supply | Qty: 60 | Fill #0

## 2016-02-29 MED FILL — ISOSORBIDE MN ER 60 MG TAB: 60 | 30 days supply | Qty: 30 | Fill #0

## 2016-02-29 MED FILL — hydrALAZINE HCL 10 MG TABS: 10 | 30 days supply | Qty: 90 | Fill #1

## 2016-02-29 MED FILL — AMLODIPINE BESYLATE 5 MG TA: 5 | 30 days supply | Qty: 30 | Fill #2

## 2016-02-29 MED FILL — HYDROCHLOROTHIAZIDE 25 MG T: 25 | 30 days supply | Qty: 30 | Fill #0

## 2016-04-03 MED FILL — ?LOSARTAN POTASSIUM 100 MG: 100 | 30 days supply | Qty: 30 | Fill #2

## 2016-05-04 MED FILL — ISOSORBIDE MN ER 60 MG TAB: 60 | 30 days supply | Qty: 30 | Fill #1

## 2016-05-04 MED FILL — HYDROCHLOROTHIAZIDE 25 MG T: 25 | 30 days supply | Qty: 30 | Fill #1

## 2016-05-04 MED FILL — ?LOSARTAN POTASSIUM 100 MG: 100 | 30 days supply | Qty: 30 | Fill #3

## 2016-05-04 MED FILL — CARVEDILOL 12.5 MG TABLET: 12.5 | 30 days supply | Qty: 60 | Fill #3

## 2016-05-04 MED FILL — AMLODIPINE BESYLATE 5 MG TA: 5 | 30 days supply | Qty: 30 | Fill #3

## 2016-08-01 MED FILL — LOSARTAN POTASSIUM 100 MG T: 100 | 30 days supply | Qty: 30 | Fill #0

## 2016-08-01 MED FILL — AMLODIPINE BESYLATE 5 MG TA: 5 | 30 days supply | Qty: 30 | Fill #4

## 2016-08-01 MED FILL — HYDROCHLOROTHIAZIDE 25 MG T: 25 | 30 days supply | Qty: 30 | Fill #2

## 2016-08-01 MED FILL — ISOSORBIDE MN ER 60 MG TAB: 60 | 30 days supply | Qty: 30 | Fill #2

## 2017-04-26 ENCOUNTER — Encounter (HOSPITAL_COMMUNITY): Payer: Self-pay | Admitting: *Deleted

## 2017-04-26 ENCOUNTER — Ambulatory Visit (HOSPITAL_COMMUNITY)
Admission: EM | Admit: 2017-04-26 | Discharge: 2017-04-26 | Disposition: A | Discharge status: Home / Self Care | Payer: No Typology Code available for payment source | Attending: Emergency Medicine | Admitting: Emergency Medicine

## 2017-04-26 DIAGNOSIS — J00 Acute nasopharyngitis [common cold]: Secondary | ICD-10-CM

## 2017-04-26 DIAGNOSIS — R05 Cough: Secondary | ICD-10-CM

## 2017-04-26 DIAGNOSIS — I10 Essential (primary) hypertension: Secondary | ICD-10-CM

## 2017-04-26 LAB — POCT I-STAT, CHEM 8
BUN: 22 mg/dL — ABNORMAL HIGH (ref 6–20)
CALCIUM ION: 1.15 mmol/L (ref 1.15–1.40)
CREATININE: 1.1 mg/dL — AB (ref 0.44–1.00)
Chloride: 99 mmol/L — ABNORMAL LOW (ref 101–111)
GLUCOSE: 105 mg/dL — AB (ref 65–99)
HCT: 50 % — ABNORMAL HIGH (ref 36.0–46.0)
HEMOGLOBIN: 17 g/dL — AB (ref 12.0–15.0)
Potassium: 3.8 mmol/L (ref 3.5–5.1)
Sodium: 137 mmol/L (ref 135–145)
TCO2: 29 mmol/L (ref 22–32)

## 2017-04-26 MED ORDER — ISOSORBIDE MONONITRATE ER 60 MG PO TB24: 60.0000 mg | ORAL_TABLET | Freq: Every day | ORAL | 0 refills | Status: DC

## 2017-04-26 MED ORDER — AMLODIPINE BESYLATE 5 MG PO TABS: 5.0000 mg | ORAL_TABLET | Freq: Every day | ORAL | 0 refills | Status: DC

## 2017-04-26 MED ORDER — LOSARTAN POTASSIUM 100 MG PO TABS: 100.0000 mg | ORAL_TABLET | Freq: Every day | ORAL | 0 refills | Status: DC

## 2017-04-26 MED ORDER — HYDROCHLOROTHIAZIDE 25 MG PO TABS
25.0000 mg | ORAL_TABLET | Freq: Every day | ORAL | 0 refills | Status: DC
Start: 2017-04-26 — End: 2017-05-06

## 2017-04-26 MED ORDER — FLUTICASONE PROPIONATE 50 MCG/ACT NA SUSP: 2.0000 | Freq: Every day | NASAL | 0 refills | Status: AC

## 2017-04-26 MED ORDER — BENZONATATE 100 MG PO CAPS: 100.0000 mg | ORAL_CAPSULE | Freq: Three times a day (TID) | ORAL | 0 refills | Status: DC

## 2017-04-26 MED ORDER — CARVEDILOL 12.5 MG PO TABS: 12.5000 mg | ORAL_TABLET | Freq: Two times a day (BID) | ORAL | 0 refills | Status: DC

## 2017-04-26 NOTE — ED Triage Notes (Signed)
Patient reports nasal drainage x 1 week. Yesterday started developing productive cough, reports yellow phlegm. States cough very strong. Patient reports feeling light headed while at work today.

## 2017-04-26 NOTE — ED Provider Notes (Signed)
[facility]    CSN: [id number] Arrival date & time: 04/26/17  1342     History   Chief Complaint Chief Complaint  Patient presents with  . Nasal Congestion  . Cough    HPI Veronica Guarnerinnette Bouley is a 56 y.o. female.   56 year old female with history of hypokalemia, TIA, hypertension comes in for one-week history of nasal congestion. She has also developed a productive cough, with chest pain while she is coughing. She also felt lightheaded this morning during work. Denies fever, chills, night sweats. Denies ear pain, eye pain. Does endorse some shortness of breath with the cough. Has been taking over-the-counter Mucinex with some relief. Denies abdominal symptoms such as nausea, vomiting, diarrhea.  Patient with blood pressure of 190/109, patient denies current chest pain, shortness of breath, headache, or blurry vision. Patient states due to finances, has not been able to follow-up with PCP, and has been taking half dose of her hypertensive medications.      Past Medical History:  Diagnosis Date  . Chest pain    a. Normal stress test 02/2014.  Marland Kitchen. Hypertension   . Obesity   . TIA (transient ischemic attack) 04/20/12  . Tobacco abuse     Patient Active Problem List   Diagnosis Date Noted  . Hypokalemia 01/27/2016  . Bruit 09/19/2015  . Hip pain 09/19/2015  . Fatigue 02/24/2014  . Precordial chest pain 02/19/2014  . HTN (hypertension) 02/10/2014  . Obesity 08/03/2013  . Smoker 08/03/2013    Past Surgical History:  Procedure Laterality Date  . CESAREAN SECTION    . cyst on thyroid Bilateral    As infant    OB History    Gravida Para Term Preterm AB Living   7 5 3 2 2 5    SAB TAB Ectopic Multiple Live Births   2 0 0 0         Home Medications    Prior to Admission medications   Medication Sig Start Date End Date Taking? Authorizing Provider  aspirin 81 MG tablet Take 1 tablet (81 mg total) by mouth daily. 07/01/14  Yes Ambrose FinlandKeck, Valerie A, NP  amLODipine  (NORVASC) 5 MG tablet Take 1 tablet (5 mg total) by mouth daily. 04/26/17   Cathie HoopsYu, Amy V, PA-C  benzonatate (TESSALON) 100 MG capsule Take 1 capsule (100 mg total) by mouth every 8 (eight) hours. 04/26/17   Cathie HoopsYu, Amy V, PA-C  carvedilol (COREG) 12.5 MG tablet Take 1 tablet (12.5 mg total) by mouth 2 (two) times daily with a meal. 04/26/17   Yu, Amy V, PA-C  fluticasone (FLONASE) 50 MCG/ACT nasal spray Place 2 sprays into both nostrils daily. 04/26/17   Cathie HoopsYu, Amy V, PA-C  hydrochlorothiazide (HYDRODIURIL) 25 MG tablet Take 1 tablet (25 mg total) by mouth daily. 04/26/17   Cathie HoopsYu, Amy V, PA-C  isosorbide mononitrate (IMDUR) 60 MG 24 hr tablet Take 1 tablet (60 mg total) by mouth daily. 04/26/17   Cathie HoopsYu, Amy V, PA-C  losartan (COZAAR) 100 MG tablet Take 1 tablet (100 mg total) by mouth daily. 04/26/17   Belinda FisherYu, Amy V, PA-C  meloxicam (MOBIC) 7.5 MG tablet Take one by mouth twice daily for hip pain 01/27/16   Veronica ShaggyAmao, Enobong, MD    Family History Family History  Problem Relation Age of Onset  . Heart disease Mother   . Hypertension Mother   . Cancer Father     Social History Social History  Substance Use Topics  . Smoking status: Current  Every Day Smoker    Packs/day: 0.25    Types: Cigarettes  . Smokeless tobacco: Never Used     Comment: Smoking 2 cigs per day  . Alcohol use 0.6 oz/week    1 Standard drinks or equivalent per week     Allergies   Penicillins   Review of Systems Review of Systems   Physical Exam Triage Vital Signs ED Triage Vitals  Enc Vitals Group     BP 04/26/17 1414 (!) 190/109     Pulse Rate 04/26/17 1414 89     Resp 04/26/17 1414 17     Temp 04/26/17 1414 99.5 F (37.5 C)     Temp Source 04/26/17 1414 Oral     SpO2 04/26/17 1414 100 %     Weight --      Height --      Head Circumference --      Peak Flow --      Pain Score 04/26/17 1413 8     Pain Loc --      Pain Edu? --      Excl. in GC? --    No data found.   Updated Vital Signs BP (!) 168/96 (BP Location: Right Arm)    Pulse 77   Temp 99.5 F (37.5 C) (Oral)   Resp 17   LMP 07/01/2014   SpO2 100%   Physical Exam  Constitutional: She is oriented to person, place, and time. She appears well-developed and well-nourished. No distress.  HENT:  Head: Normocephalic and atraumatic.  Right Ear: Tympanic membrane, external ear and ear canal normal. Tympanic membrane is not erythematous and not bulging.  Left Ear: Tympanic membrane, external ear and ear canal normal. Tympanic membrane is not erythematous and not bulging.  Nose: Mucosal edema and rhinorrhea present. Right sinus exhibits no maxillary sinus tenderness and no frontal sinus tenderness. Left sinus exhibits no maxillary sinus tenderness and no frontal sinus tenderness.  Mouth/Throat: Uvula is midline, oropharynx is clear and moist and mucous membranes are normal.  Eyes: Pupils are equal, round, and reactive to light. Conjunctivae are normal.  Neck: Normal range of motion. Neck supple.  Cardiovascular: Normal rate, regular rhythm and normal heart sounds.  Exam reveals no gallop and no friction rub.   No murmur heard. Pulmonary/Chest: Effort normal and breath sounds normal. She has no decreased breath sounds. She has no wheezes. She has no rhonchi. She has no rales.  Lymphadenopathy:    She has no cervical adenopathy.  Neurological: She is alert and oriented to person, place, and time. She has normal strength. She is not disoriented. No cranial nerve deficit or sensory deficit. She displays a negative Romberg sign. GCS eye subscore is 4. GCS verbal subscore is 5. GCS motor subscore is 6.  Skin: Skin is warm and dry.  Psychiatric: She has a normal mood and affect. Her behavior is normal. Judgment normal.     UC Treatments / Results  Labs (all labs ordered are listed, but only abnormal results are displayed) Labs Reviewed  POCT I-STAT, CHEM 8 - Abnormal; Notable for the following:       Result Value   Chloride 99 (*)    BUN 22 (*)    Creatinine, Ser  1.10 (*)    Glucose, Bld 105 (*)    Hemoglobin 17.0 (*)    HCT 50.0 (*)    All other components within normal limits    EKG  EKG Interpretation None  Radiology No results found.  Procedures Procedures (including critical care time)  Medications Ordered in UC Medications - No data to display   Initial Impression / Assessment and Plan / UC Course  I have reviewed the triage vital signs and the nursing notes.  Pertinent labs & imaging results that were available during my care of the patient were reviewed by me and considered in my medical decision making (see chart for details).     Discussed with patient history and exam most consistent with viral URI. Symptomatic treatment as needed. Push fluids. Blood pressure improved after recheck, creatinine improved since last year, we'll refill her antihypertensives. Patient to follow up as scheduled with PCP for further evaluation, management of hypertension. Strict return precautions given.   Final Clinical Impressions(s) / UC Diagnoses   Final diagnoses:  Acute nasopharyngitis    New Prescriptions New Prescriptions   BENZONATATE (TESSALON) 100 MG CAPSULE    Take 1 capsule (100 mg total) by mouth every 8 (eight) hours.   FLUTICASONE (FLONASE) 50 MCG/ACT NASAL SPRAY    Place 2 sprays into both nostrils daily.       Belinda Fisher, PA-C 04/26/17 [phone]

## 2017-04-26 NOTE — Discharge Instructions (Addendum)
Tessalon for cough. Start flonase for nasal congestion. You can use over the counter nasal saline rinse such as neti pot for nasal congestion. Keep hydrated, your urine should be clear to pale yellow in color. Tylenol/motrin for fever and pain. Monitor for any worsening of symptoms, chest pain, shortness of breath, wheezing, swelling of the throat, follow up for reevaluation.   I have refilled your blood pressure medications. Please follow up with PCP as scheduled for further refills, management of high blood pressure. If blood pressure continues to be elevated, and experience any chest pain, shortness of breath, weakness, dizziness, headache, blurry vision, please go immediately to the emergency department for further evaluation.

## 2017-05-01 ENCOUNTER — Emergency Department (HOSPITAL_COMMUNITY)
Admission: EM | Admit: 2017-05-01 | Discharge: 2017-05-01 | Disposition: A | Discharge status: Home / Self Care | Payer: No Typology Code available for payment source | Attending: Emergency Medicine | Admitting: Emergency Medicine

## 2017-05-01 ENCOUNTER — Encounter (HOSPITAL_COMMUNITY): Payer: Self-pay

## 2017-05-01 DIAGNOSIS — R197 Diarrhea, unspecified: Secondary | ICD-10-CM | POA: Insufficient documentation

## 2017-05-01 DIAGNOSIS — Z5321 Procedure and treatment not carried out due to patient leaving prior to being seen by health care provider: Secondary | ICD-10-CM | POA: Insufficient documentation

## 2017-05-01 LAB — COMPREHENSIVE METABOLIC PANEL
ALK PHOS: 95 U/L (ref 38–126)
ALT: 95 U/L — AB (ref 14–54)
AST: 220 U/L — AB (ref 15–41)
Albumin: 3.1 g/dL — ABNORMAL LOW (ref 3.5–5.0)
Anion gap: 18 — ABNORMAL HIGH (ref 5–15)
BUN: 60 mg/dL — AB (ref 6–20)
CHLORIDE: 91 mmol/L — AB (ref 101–111)
CO2: 22 mmol/L (ref 22–32)
CREATININE: 5.42 mg/dL — AB (ref 0.44–1.00)
Calcium: 9.1 mg/dL (ref 8.9–10.3)
GFR calc Af Amer: 9 mL/min — ABNORMAL LOW (ref >60)
GFR, EST NON AFRICAN AMERICAN: 8 mL/min — AB (ref >60)
Glucose, Bld: 114 mg/dL — ABNORMAL HIGH (ref 65–99)
Potassium: 3.3 mmol/L — ABNORMAL LOW (ref 3.5–5.1)
SODIUM: 131 mmol/L — AB (ref 135–145)
Total Bilirubin: 4 mg/dL — ABNORMAL HIGH (ref 0.3–1.2)
Total Protein: 7.4 g/dL (ref 6.5–8.1)

## 2017-05-01 LAB — CBC
HCT: 42 % (ref 36.0–46.0)
Hemoglobin: 14.6 g/dL (ref 12.0–15.0)
MCH: 31 pg (ref 26.0–34.0)
MCHC: 34.8 g/dL (ref 30.0–36.0)
MCV: 89.2 fL (ref 78.0–100.0)
PLATELETS: 181 10*3/uL (ref 150–400)
RBC: 4.71 MIL/uL (ref 3.87–5.11)
RDW: 13.9 % (ref 11.5–15.5)
WBC: 8.1 10*3/uL (ref 4.0–10.5)

## 2017-05-01 LAB — LIPASE, BLOOD: LIPASE: 35 U/L (ref 11–51)

## 2017-05-01 NOTE — ED Triage Notes (Signed)
Patient c/o diarrhea x 3 days. Patient states she had abdominal cramping that started 3 days, but has lessened. Patient denies near syncope.

## 2017-05-01 NOTE — ED Notes (Signed)
Called to take to room  No response from lobby  

## 2017-05-01 NOTE — ED Triage Notes (Signed)
Called  No response from lobby 

## 2017-05-02 ENCOUNTER — Emergency Department (HOSPITAL_COMMUNITY): Payer: Self-pay

## 2017-05-02 ENCOUNTER — Inpatient Hospital Stay (HOSPITAL_COMMUNITY): Payer: Self-pay

## 2017-05-02 ENCOUNTER — Inpatient Hospital Stay (HOSPITAL_COMMUNITY)
Admission: EM | Admit: 2017-05-02 | Discharge: 2017-05-06 | DRG: 682 | Disposition: A | Discharge status: Home / Self Care | Payer: Self-pay | Attending: Internal Medicine | Admitting: Internal Medicine

## 2017-05-02 ENCOUNTER — Encounter (HOSPITAL_COMMUNITY): Payer: Self-pay | Admitting: Emergency Medicine

## 2017-05-02 ENCOUNTER — Other Ambulatory Visit (HOSPITAL_COMMUNITY): Payer: No Typology Code available for payment source

## 2017-05-02 DIAGNOSIS — I4581 Long QT syndrome: Secondary | ICD-10-CM | POA: Diagnosis present

## 2017-05-02 DIAGNOSIS — N179 Acute kidney failure, unspecified: Secondary | ICD-10-CM | POA: Diagnosis present

## 2017-05-02 DIAGNOSIS — R945 Abnormal results of liver function studies: Secondary | ICD-10-CM

## 2017-05-02 DIAGNOSIS — M6282 Rhabdomyolysis: Secondary | ICD-10-CM | POA: Diagnosis present

## 2017-05-02 DIAGNOSIS — Z8673 Personal history of transient ischemic attack (TIA), and cerebral infarction without residual deficits: Secondary | ICD-10-CM

## 2017-05-02 DIAGNOSIS — R0600 Dyspnea, unspecified: Secondary | ICD-10-CM

## 2017-05-02 DIAGNOSIS — R7401 Elevation of levels of liver transaminase levels: Secondary | ICD-10-CM | POA: Diagnosis present

## 2017-05-02 DIAGNOSIS — F17210 Nicotine dependence, cigarettes, uncomplicated: Secondary | ICD-10-CM | POA: Diagnosis present

## 2017-05-02 DIAGNOSIS — A419 Sepsis, unspecified organism: Secondary | ICD-10-CM

## 2017-05-02 DIAGNOSIS — J181 Lobar pneumonia, unspecified organism: Secondary | ICD-10-CM

## 2017-05-02 DIAGNOSIS — Z880 Allergy status to penicillin: Secondary | ICD-10-CM

## 2017-05-02 DIAGNOSIS — E872 Acidosis: Secondary | ICD-10-CM | POA: Diagnosis present

## 2017-05-02 DIAGNOSIS — T39395A Adverse effect of other nonsteroidal anti-inflammatory drugs [NSAID], initial encounter: Secondary | ICD-10-CM | POA: Diagnosis present

## 2017-05-02 DIAGNOSIS — Z221 Carrier of other intestinal infectious diseases: Secondary | ICD-10-CM

## 2017-05-02 DIAGNOSIS — E860 Dehydration: Secondary | ICD-10-CM | POA: Diagnosis present

## 2017-05-02 DIAGNOSIS — E861 Hypovolemia: Secondary | ICD-10-CM | POA: Diagnosis present

## 2017-05-02 DIAGNOSIS — J189 Pneumonia, unspecified organism: Secondary | ICD-10-CM | POA: Diagnosis present

## 2017-05-02 DIAGNOSIS — N170 Acute kidney failure with tubular necrosis: Principal | ICD-10-CM | POA: Diagnosis present

## 2017-05-02 DIAGNOSIS — Z8249 Family history of ischemic heart disease and other diseases of the circulatory system: Secondary | ICD-10-CM

## 2017-05-02 DIAGNOSIS — E876 Hypokalemia: Secondary | ICD-10-CM | POA: Diagnosis present

## 2017-05-02 DIAGNOSIS — K7689 Other specified diseases of liver: Secondary | ICD-10-CM

## 2017-05-02 DIAGNOSIS — R740 Nonspecific elevation of levels of transaminase and lactic acid dehydrogenase [LDH]: Secondary | ICD-10-CM | POA: Diagnosis present

## 2017-05-02 DIAGNOSIS — I10 Essential (primary) hypertension: Secondary | ICD-10-CM

## 2017-05-02 DIAGNOSIS — I110 Hypertensive heart disease with heart failure: Secondary | ICD-10-CM | POA: Diagnosis present

## 2017-05-02 DIAGNOSIS — A481 Legionnaires' disease: Secondary | ICD-10-CM | POA: Diagnosis present

## 2017-05-02 DIAGNOSIS — R17 Unspecified jaundice: Secondary | ICD-10-CM | POA: Diagnosis present

## 2017-05-02 DIAGNOSIS — R197 Diarrhea, unspecified: Secondary | ICD-10-CM | POA: Diagnosis present

## 2017-05-02 DIAGNOSIS — I5032 Chronic diastolic (congestive) heart failure: Secondary | ICD-10-CM | POA: Diagnosis present

## 2017-05-02 DIAGNOSIS — E871 Hypo-osmolality and hyponatremia: Secondary | ICD-10-CM | POA: Diagnosis present

## 2017-05-02 DIAGNOSIS — R0902 Hypoxemia: Secondary | ICD-10-CM | POA: Diagnosis present

## 2017-05-02 LAB — CBC WITH DIFFERENTIAL/PLATELET
BASOS PCT: 0 %
Basophils Absolute: 0 10*3/uL (ref 0.0–0.1)
EOS ABS: 0 10*3/uL (ref 0.0–0.7)
Eosinophils Relative: 0 %
HEMATOCRIT: 41.9 % (ref 36.0–46.0)
HEMOGLOBIN: 14.6 g/dL (ref 12.0–15.0)
LYMPHS ABS: 0.8 10*3/uL (ref 0.7–4.0)
Lymphocytes Relative: 11 %
MCH: 31.2 pg (ref 26.0–34.0)
MCHC: 34.8 g/dL (ref 30.0–36.0)
MCV: 89.5 fL (ref 78.0–100.0)
Monocytes Absolute: 0.2 10*3/uL (ref 0.1–1.0)
Monocytes Relative: 3 %
NEUTROS ABS: 6.5 10*3/uL (ref 1.7–7.7)
NEUTROS PCT: 86 %
Platelets: 180 10*3/uL (ref 150–400)
RBC: 4.68 MIL/uL (ref 3.87–5.11)
RDW: 13.8 % (ref 11.5–15.5)
WBC: 7.5 10*3/uL (ref 4.0–10.5)

## 2017-05-02 LAB — URINALYSIS, ROUTINE W REFLEX MICROSCOPIC
BILIRUBIN URINE: NEGATIVE
GLUCOSE, UA: NEGATIVE mg/dL
KETONES UR: NEGATIVE mg/dL
LEUKOCYTES UA: NEGATIVE
NITRITE: NEGATIVE
PROTEIN: 100 mg/dL — AB
Specific Gravity, Urine: 1.016 (ref 1.005–1.030)
WBC, UA: NONE SEEN WBC/hpf (ref 0–5)
pH: 5 (ref 5.0–8.0)

## 2017-05-02 LAB — COMPREHENSIVE METABOLIC PANEL
ALK PHOS: 87 U/L (ref 38–126)
ALT: 96 U/L — ABNORMAL HIGH (ref 14–54)
ANION GAP: 18 — AB (ref 5–15)
AST: 225 U/L — ABNORMAL HIGH (ref 15–41)
Albumin: 2.8 g/dL — ABNORMAL LOW (ref 3.5–5.0)
BUN: 66 mg/dL — ABNORMAL HIGH (ref 6–20)
CALCIUM: 8.9 mg/dL (ref 8.9–10.3)
CHLORIDE: 91 mmol/L — AB (ref 101–111)
CO2: 20 mmol/L — AB (ref 22–32)
Creatinine, Ser: 6.54 mg/dL — ABNORMAL HIGH (ref 0.44–1.00)
GFR calc non Af Amer: 6 mL/min — ABNORMAL LOW (ref >60)
GFR, EST AFRICAN AMERICAN: 7 mL/min — AB (ref >60)
Glucose, Bld: 107 mg/dL — ABNORMAL HIGH (ref 65–99)
POTASSIUM: 3.4 mmol/L — AB (ref 3.5–5.1)
SODIUM: 129 mmol/L — AB (ref 135–145)
Total Bilirubin: 4.6 mg/dL — ABNORMAL HIGH (ref 0.3–1.2)
Total Protein: 7.5 g/dL (ref 6.5–8.1)

## 2017-05-02 LAB — TROPONIN I
Troponin I: 0.08 ng/mL (ref <0.03)
Troponin I: 0.08 ng/mL (ref <0.03)
Troponin I: 0.23 ng/mL (ref <0.03)

## 2017-05-02 LAB — PROTIME-INR
INR: 1.05
Prothrombin Time: 13.6 seconds (ref 11.4–15.2)

## 2017-05-02 LAB — I-STAT CG4 LACTIC ACID, ED
Lactic Acid, Venous: 1.28 mmol/L (ref 0.5–1.9)
Lactic Acid, Venous: 1.05 mmol/L (ref 0.5–1.9)

## 2017-05-02 LAB — CBC
HCT: 37.9 % (ref 36.0–46.0)
Hemoglobin: 13 g/dL (ref 12.0–15.0)
MCH: 30.9 pg (ref 26.0–34.0)
MCHC: 34.3 g/dL (ref 30.0–36.0)
MCV: 90 fL (ref 78.0–100.0)
PLATELETS: 156 10*3/uL (ref 150–400)
RBC: 4.21 MIL/uL (ref 3.87–5.11)
RDW: 13.9 % (ref 11.5–15.5)
WBC: 6.2 10*3/uL (ref 4.0–10.5)

## 2017-05-02 LAB — STREP PNEUMONIAE URINARY ANTIGEN: STREP PNEUMO URINARY ANTIGEN: NEGATIVE

## 2017-05-02 LAB — CREATININE, SERUM
CREATININE: 6.32 mg/dL — AB (ref 0.44–1.00)
GFR calc non Af Amer: 7 mL/min — ABNORMAL LOW (ref >60)
GFR, EST AFRICAN AMERICAN: 8 mL/min — AB (ref >60)

## 2017-05-02 LAB — CK TOTAL AND CKMB (NOT AT ARMC)
CK TOTAL: 1636 U/L — AB (ref 38–234)
CK, MB: 11.8 ng/mL — AB (ref 0.5–5.0)
Relative Index: 0.7 (ref 0.0–2.5)

## 2017-05-02 LAB — SODIUM, URINE, RANDOM: Sodium, Ur: <10 mmol/L

## 2017-05-02 LAB — TSH: TSH: 0.625 u[IU]/mL (ref 0.350–4.500)

## 2017-05-02 LAB — CREATININE, URINE, RANDOM: CREATININE, URINE: 316.96 mg/dL

## 2017-05-02 LAB — CK: CK TOTAL: 1621 U/L — AB (ref 38–234)

## 2017-05-02 LAB — CORTISOL: Cortisol, Plasma: 37 ug/dL

## 2017-05-02 LAB — OSMOLALITY, URINE: Osmolality, Ur: 310 mOsm/kg (ref 300–900)

## 2017-05-02 MED ORDER — SODIUM CHLORIDE 0.9 % IV BOLUS (SEPSIS)
1000.0000 mL | Freq: Once | INTRAVENOUS | Status: AC
  Administered 2017-05-02: 1000 mL via INTRAVENOUS

## 2017-05-02 MED ORDER — VANCOMYCIN HCL IN DEXTROSE 1-5 GM/200ML-% IV SOLN
1000.0000 mg | Freq: Once | INTRAVENOUS | Status: AC
  Administered 2017-05-02: 1000 mg via INTRAVENOUS
  Filled 2017-05-02: qty 200

## 2017-05-02 MED ORDER — LEVOFLOXACIN IN D5W 500 MG/100ML IV SOLN: 500.0000 mg | INTRAVENOUS | Status: DC

## 2017-05-02 MED ORDER — CARVEDILOL 3.125 MG PO TABS
3.1250 mg | ORAL_TABLET | Freq: Two times a day (BID) | ORAL | Status: DC
  Administered 2017-05-03 (×2): 3.125 mg via ORAL
  Filled 2017-05-02 (×2): qty 1

## 2017-05-02 MED ORDER — POTASSIUM CHLORIDE CRYS ER 20 MEQ PO TBCR
40.0000 meq | EXTENDED_RELEASE_TABLET | Freq: Once | ORAL | Status: AC
  Administered 2017-05-02: 40 meq via ORAL
  Filled 2017-05-02: qty 2

## 2017-05-02 MED ORDER — ASPIRIN 81 MG PO CHEW
81.0000 mg | CHEWABLE_TABLET | Freq: Every day | ORAL | Status: DC
  Administered 2017-05-02 – 2017-05-06 (×5): 81 mg via ORAL
  Filled 2017-05-02 (×5): qty 1

## 2017-05-02 MED ORDER — SODIUM CHLORIDE 0.9 % IV BOLUS (SEPSIS)
500.0000 mL | Freq: Once | INTRAVENOUS | Status: AC
  Administered 2017-05-02: 500 mL via INTRAVENOUS

## 2017-05-02 MED ORDER — HYDROCODONE-HOMATROPINE 5-1.5 MG/5ML PO SYRP
5.0000 mL | ORAL_SOLUTION | Freq: Four times a day (QID) | ORAL | Status: DC | PRN
  Administered 2017-05-03 – 2017-05-05 (×3): 5 mL via ORAL
  Filled 2017-05-02 (×3): qty 5

## 2017-05-02 MED ORDER — CARVEDILOL 12.5 MG PO TABS
12.5000 mg | ORAL_TABLET | Freq: Two times a day (BID) | ORAL | Status: DC
  Administered 2017-05-02: 12.5 mg via ORAL
  Filled 2017-05-02: qty 1

## 2017-05-02 MED ORDER — LEVOFLOXACIN IN D5W 750 MG/150ML IV SOLN
750.0000 mg | Freq: Once | INTRAVENOUS | Status: AC
  Administered 2017-05-02: 750 mg via INTRAVENOUS
  Filled 2017-05-02: qty 150

## 2017-05-02 MED ORDER — HEPARIN SODIUM (PORCINE) 5000 UNIT/ML IJ SOLN
5000.0000 [IU] | Freq: Three times a day (TID) | INTRAMUSCULAR | Status: DC
  Administered 2017-05-02 – 2017-05-06 (×12): 5000 [IU] via SUBCUTANEOUS
  Filled 2017-05-02 (×12): qty 1

## 2017-05-02 MED ORDER — SODIUM CHLORIDE 0.9 % IV SOLN
1000.0000 mL | INTRAVENOUS | Status: DC
  Administered 2017-05-02: 1000 mL via INTRAVENOUS

## 2017-05-02 MED ORDER — SODIUM CHLORIDE 0.9 % IV SOLN
INTRAVENOUS | Status: DC
  Administered 2017-05-02 – 2017-05-04 (×5): via INTRAVENOUS

## 2017-05-02 MED ORDER — ACETAMINOPHEN 500 MG PO TABS
1000.0000 mg | ORAL_TABLET | Freq: Once | ORAL | Status: AC
  Administered 2017-05-02: 1000 mg via ORAL
  Filled 2017-05-02: qty 2

## 2017-05-02 NOTE — H&P (Addendum)
TRH H&P   Patient Demographics:    Veronica Turner, is a 56 y.o. female  MRN: [id number]   DOB - 03-May-1961  Admit Date - 05/02/2017  Outpatient Primary MD for the patient is Patient, No Pcp Per NONE  Referring MD/NP/PA: Belenda Cruise Ward  Outpatient Specialists:   Patient coming from: home  Chief Complaint  Patient presents with  . Fatigue  . Altered Mental Status  . Cough      HPI:    Veronica Turner  is a 56 y.o. female, w hypertension, c/o cough for the past 1 week, and dyspnea.  Cough productive yellow sputum.  + fever for the past 1 day. Slight diarrhea, "loose stool" for the past 2 days.   Pt denies cp, palp, n/v, abd pain,  brbpr. Pt had been to urgent care on 9/7 and treated with tessalon pearls.    In ED, CXR showed extensive RUL pneumonia.  Pt wbc 7.5, Bun/creatinine 66/6.54, Glucose 107,  Pt will be admitted for ARF, and pneumonia, and hypokalemia   Review of systems:    In addition to the HPI above,   No Headache, No changes with Vision or hearing, No problems swallowing food or Liquids, Slight cp with cough No Abdominal pain, No Nausea or Vommitting, No Blood in stool or Urine, No dysuria, No new skin rashes or bruises, No new joints pains-aches,  No new weakness, tingling, numbness in any extremity, No recent weight gain or loss, No polyuria, polydypsia or polyphagia, No significant Mental Stressors.  A full 10 point Review of Systems was done, except as stated above, all other Review of Systems were negative.   With Past History of the following :    Past Medical History:  Diagnosis Date  . Chest pain    a. Normal stress test 02/2014.  Marland Kitchen Hypertension   . Obesity   . TIA (transient ischemic attack) 04/20/12  . Tobacco abuse       Past Surgical History:  Procedure Laterality Date  . CESAREAN SECTION    . cyst on thyroid Bilateral    As  infant      Social History:     Social History  Substance Use Topics  . Smoking status: Current Every Day Smoker    Packs/day: 0.15    Types: Cigarettes  . Smokeless tobacco: Never Used     Comment: Smoking 2 cigs per day  . Alcohol use No     Lives - at home  Mobility - walks by self   Family History :     Family History  Problem Relation Age of Onset  . Heart disease Mother   . Hypertension Mother   . Cancer Father       Home Medications:   Prior to Admission medications   Medication Sig Start Date End Date Taking? Authorizing Provider  amLODipine (NORVASC) 5 MG tablet Take  1 tablet (5 mg total) by mouth daily. 04/26/17   Belinda Fisher, PA-C  aspirin 81 MG tablet Take 1 tablet (81 mg total) by mouth daily. 07/01/14   Ambrose Finland, NP  benzonatate (TESSALON) 100 MG capsule Take 1 capsule (100 mg total) by mouth every 8 (eight) hours. 04/26/17   Cathie Hoops, Amy V, PA-C  carvedilol (COREG) 12.5 MG tablet Take 1 tablet (12.5 mg total) by mouth 2 (two) times daily with a meal. 04/26/17   Yu, Amy V, PA-C  fluticasone (FLONASE) 50 MCG/ACT nasal spray Place 2 sprays into both nostrils daily. 04/26/17   Cathie Hoops, Amy V, PA-C  hydrochlorothiazide (HYDRODIURIL) 25 MG tablet Take 1 tablet (25 mg total) by mouth daily. 04/26/17   Cathie Hoops, Amy V, PA-C  isosorbide mononitrate (IMDUR) 60 MG 24 hr tablet Take 1 tablet (60 mg total) by mouth daily. 04/26/17   Cathie Hoops, Amy V, PA-C  losartan (COZAAR) 100 MG tablet Take 1 tablet (100 mg total) by mouth daily. 04/26/17   Belinda Fisher, PA-C  meloxicam (MOBIC) 7.5 MG tablet Take one by mouth twice daily for hip pain 01/27/16   Jaclyn Shaggy, MD     Allergies:     Allergies  Allergen Reactions  . Penicillins Anaphylaxis    Throat closes  Has patient had a PCN reaction causing immediate rash, facial/tongue/throat swelling, SOB or lightheadedness with hypotension: Yes Has patient had a PCN reaction causing severe rash involving mucus membranes or skin necrosis: Unknown Has  patient had a PCN reaction that required hospitalization: Yes Has patient had a PCN reaction occurring within the last 10 years: No If all of the above answers are "NO", then may proceed with Cephalosporin use.      Physical Exam:   Vitals  Blood pressure 123/76, pulse 84, temperature (!) 102.1 F (38.9 C), temperature source Rectal, resp. rate (!) 25, height  (1.6 m), weight 75.8 kg (167 lb), last menstrual period 07/01/2014, SpO2 95 %.   1. General  lying in bed in NAD,    2. Normal affect and insight, Not Suicidal or Homicidal, Awake Alert, Oriented X 3.  3. No F.N deficits, ALL C.Nerves Intact, Strength 5/5 all 4 extremities, Sensation intact all 4 extremities, Plantars down going.  4. Ears and Eyes appear Normal, Conjunctivae clear, PERRLA. Moist Oral Mucosa.  5. Supple Neck, No JVD, No cervical lymphadenopathy appriciated, No Carotid Bruits.  6. Symmetrical Chest wall movement, Good air movement bilaterally,  Crackles in RUL, no wheezing  7. RRR, No Gallops, Rubs or Murmurs, No Parasternal Heave.  8. Positive Bowel Sounds, Abdomen Soft, No tenderness, No organomegaly appriciated,No rebound -guarding or rigidity.  9.  No Cyanosis, Normal Skin Turgor, No Skin Rash or Bruise.  10. Good muscle tone,  joints appear normal , no effusions, Normal ROM.  11. No Palpable Lymph Nodes in Neck or Axillae     Data Review:    CBC  Recent Labs Lab 04/26/17 1553 05/01/17 1627 05/02/17 0200  WBC  --  8.1 7.5  HGB 17.0* 14.6 14.6  HCT 50.0* 42.0 41.9  PLT  --  181 180  MCV  --  89.2 89.5  MCH  --  31.0 31.2  MCHC  --  34.8 34.8  RDW  --  13.9 13.8  LYMPHSABS  --   --  0.8  MONOABS  --   --  0.2  EOSABS  --   --  0.0  BASOSABS  --   --  0.0   ------------------------------------------------------------------------------------------------------------------  Chemistries   Recent Labs Lab 04/26/17 1553 05/01/17 1627 05/02/17 0200  NA 137 131* 129*  K 3.8 3.3*  3.4*  CL 99* 91* 91*  CO2  --  22 20*  GLUCOSE 105* 114* 107*  BUN 22* 60* 66*  CREATININE 1.10* 5.42* 6.54*  CALCIUM  --  9.1 8.9  AST  --  220* 225*  ALT  --  95* 96*  ALKPHOS  --  95 87  BILITOT  --  4.0* 4.6*   ------------------------------------------------------------------------------------------------------------------ estimated creatinine clearance is 9.5 mL/min (A) (by C-G formula based on SCr of 6.54 mg/dL (H)). ------------------------------------------------------------------------------------------------------------------ No results for input(s): TSH, T4TOTAL, T3FREE, THYROIDAB in the last 72 hours.  Invalid input(s): FREET3  Coagulation profile  Recent Labs Lab 05/02/17 0200  INR 1.05   ------------------------------------------------------------------------------------------------------------------- No results for input(s): DDIMER in the last 72 hours. -------------------------------------------------------------------------------------------------------------------  Cardiac Enzymes No results for input(s): CKMB, TROPONINI, MYOGLOBIN in the last 168 hours.  Invalid input(s): CK ------------------------------------------------------------------------------------------------------------------ No results found for: BNP   ---------------------------------------------------------------------------------------------------------------  Urinalysis    Component Value Date/Time   COLORURINE AMBER (A) 12/15/2015 0934   APPEARANCEUR CLOUDY (A) 12/15/2015 0934   LABSPEC 1.016 12/15/2015 0934   PHURINE 6.0 12/15/2015 0934   GLUCOSEU NEGATIVE 12/15/2015 0934   HGBUR NEGATIVE 12/15/2015 0934   BILIRUBINUR NEGATIVE 12/15/2015 0934   KETONESUR 15 (A) 12/15/2015 0934   PROTEINUR 30 (A) 12/15/2015 0934   NITRITE NEGATIVE 12/15/2015 0934   LEUKOCYTESUR NEGATIVE 12/15/2015 0934     ----------------------------------------------------------------------------------------------------------------   Imaging Results:    Dg Chest 2 View  Result Date: 05/02/2017 CLINICAL DATA:  Cough and weakness for 2 days. EXAM: CHEST  2 VIEW COMPARISON:  PA and lateral chest 12/15/2015. FINDINGS: There is extensive right upper lobe airspace disease. The left lung is clear. Heart size is normal. No pneumothorax or pleural fluid. IMPRESSION: Extensive right upper lobe airspace disease consistent with pneumonia. Recommend followup to clearing. Electronically Signed   By: Drusilla Kannerhomas  Dalessio M.D.   On: 05/02/2017 01:15   nsr at 98, nl axis, nl int, no st-t changes c/w ischemia   Assessment & Plan:    Principal Problem:   ARF (acute renal failure) (HCC) Active Problems:   Hypokalemia   Community acquired pneumonia   Hyponatremia    ARF Check urine sodium, urine creatinine urine eosinophils Check renal ultrasound STOP hydrochlorothiazide, STOP Losartan STOP NSAIDS Hydrate with ns iv Consider nephrology consult if creatinine not improving  CAP Blood culture Sputum gram stain and culture vanco iv, levaquin iv  Fever secondary to above Check ua  Hypokalemia Check cmp in am  Hyponatremia Check cortisol, check tsh, check serum osm Check urine sodium, urine osm  Hyperglycemia Check hga1c  CP Tele Trop I q6h x3 Check cardiac echo  Diarrhea Stool studies  Abnormal lft Check acute hepatitis panel RUQ ultrasound  DVT Prophylaxis Heparin -  SCDs  AM Labs Ordered, also please review Full Orders  Family Communication: Admission, patients condition and plan of care including tests being ordered have been discussed with the patient  who indicate understanding and agree with the plan and Code Status.  Code Status FULL CODE  Likely DC to  home  Condition GUARDED    Consults called: none  Admission status: inpatient   Time spent in minutes : 45  minutes   Pearson GrippeJames Kvon Mcilhenny M.D on 05/02/2017 at 3:34 AM  Between 7am to 7pm - Pager - [phone]. After 7pm go to [email or web address] -  password [facility]  [facility] - Office  [phone]

## 2017-05-02 NOTE — ED Notes (Signed)
Attempted report x1. 

## 2017-05-02 NOTE — ED Notes (Signed)
Attempted report x 2 

## 2017-05-02 NOTE — ED Notes (Signed)
Patient transported to X-ray 

## 2017-05-02 NOTE — ED Provider Notes (Signed)
TIME SEEN: 12:56 AM  CHIEF COMPLAINT: Altered mental status, cough  HPI: Patient is a 56 year old female with history of hypertension, TIA who presents to the emergency department with complaints of fevers, cough and shortness of breath. Patient was seen in urgent care on September 7 for similar symptoms and was diagnosed with nasopharyngitis and was started on [employer] and Flonase. Was not put on antibiotics. Symptoms progressively worsening. Family reports that they felt like she was confused tonight. She has hypoxic on room air. She does not wear oxygen at home.  She states she feels short of breath. Denies to be chest pain. Denies headache or neck pain. Denies abdominal pain. Denies vomiting but has had diarrhea. No sick contacts or recent travel.   ROS: See HPI Constitutional:  fever  Eyes: no drainage  ENT: no runny nose   Cardiovascular:  no chest pain  Resp:  SOB  GI: no vomiting GU: no dysuria Integumentary: no rash  Allergy: no hives  Musculoskeletal: no leg swelling  Neurological: no slurred speech ROS otherwise negative  PAST MEDICAL HISTORY/PAST SURGICAL HISTORY:  Past Medical History:  Diagnosis Date  . Chest pain    a. Normal stress test 02/2014.  Marland Kitchen Hypertension   . Obesity   . TIA (transient ischemic attack) 04/20/12  . Tobacco abuse     MEDICATIONS:  Prior to Admission medications   Medication Sig Start Date End Date Taking? Authorizing Provider  amLODipine (NORVASC) 5 MG tablet Take 1 tablet (5 mg total) by mouth daily. 04/26/17   Belinda Fisher, PA-C  aspirin 81 MG tablet Take 1 tablet (81 mg total) by mouth daily. 07/01/14   Ambrose Finland, NP  benzonatate (TESSALON) 100 MG capsule Take 1 capsule (100 mg total) by mouth every 8 (eight) hours. 04/26/17   Cathie Hoops, Amy V, PA-C  carvedilol (COREG) 12.5 MG tablet Take 1 tablet (12.5 mg total) by mouth 2 (two) times daily with a meal. 04/26/17   Yu, Amy V, PA-C  fluticasone (FLONASE) 50 MCG/ACT nasal spray Place 2 sprays  into both nostrils daily. 04/26/17   Cathie Hoops, Amy V, PA-C  hydrochlorothiazide (HYDRODIURIL) 25 MG tablet Take 1 tablet (25 mg total) by mouth daily. 04/26/17   Cathie Hoops, Amy V, PA-C  isosorbide mononitrate (IMDUR) 60 MG 24 hr tablet Take 1 tablet (60 mg total) by mouth daily. 04/26/17   Cathie Hoops, Amy V, PA-C  losartan (COZAAR) 100 MG tablet Take 1 tablet (100 mg total) by mouth daily. 04/26/17   Belinda Fisher, PA-C  meloxicam (MOBIC) 7.5 MG tablet Take one by mouth twice daily for hip pain 01/27/16   Jaclyn Shaggy, MD    ALLERGIES:  Allergies  Allergen Reactions  . Penicillins Anaphylaxis    Throat closes     SOCIAL HISTORY:  Social History  Substance Use Topics  . Smoking status: Current Every Day Smoker    Packs/day: 0.15    Types: Cigarettes  . Smokeless tobacco: Never Used     Comment: Smoking 2 cigs per day  . Alcohol use No    FAMILY HISTORY: Family History  Problem Relation Age of Onset  . Heart disease Mother   . Hypertension Mother   . Cancer Father     EXAM: BP 120/74 (BP Location: Right Arm)   Pulse 91   Temp 98.7 F (37.1 C) (Oral)   Resp (!) 28   Ht  (1.6 m)   Wt 75.8 kg (167 lb)   LMP 07/01/2014  SpO2 (!) 89%   BMI 29.58 kg/m  CONSTITUTIONAL: Alert and oriented 3 but does have some difficulty answering what year it is and responds appropriately to questions. Appears sick, arousable to voice HEAD: Normocephalic EYES: Conjunctivae clear, pupils appear equal, EOMI ENT: normal nose; very dry mucous membranes NECK: Supple, no meningismus, no nuchal rigidity, no LAD  CARD: RRR; S1 and S2 appreciated; no murmurs, no clicks, no rubs, no gallops RESP: Normal chest excursion without splinting, patient is tachypneic and hypoxic, very diminished breath sounds on the right side, diffuse scattered wheezing, hypoxic on room air, in no significant respiratory distress ABD/GI: Normal bowel sounds; non-distended; soft, non-tender, no rebound, no guarding, no peritoneal signs, no  hepatosplenomegaly BACK:  The back appears normal and is non-tender to palpation, there is no CVA tenderness EXT: Normal ROM in all joints; non-tender to palpation; no edema; normal capillary refill; no cyanosis, no calf tenderness or swelling    SKIN: Normal color for age and race; warm; no rash NEURO: Moves all extremities equally, normal sensation diffusely, cranial nerves II through XII intact, normal speech PSYCH: The patient's mood and manner are appropriate. Grooming and personal hygiene are appropriate.  MEDICAL DECISION MAKING: Patient here with concerns for sepsis. She is febrile and hypoxic. I'm concerned for pneumonia. We'll obtain labs, cultures, urine, chest x-ray. She will need admission.  ED PROGRESS: Patient's labs show elevated liver function tests. She has no abdominal tenderness on exam. Suspect this could be an organ damage from sepsis. Her lactate is normal. She also has acute renal failure with a normal creatinine on 04/26/17. She is still making urine. Urinalysis pending. Chest x-ray shows extensive right-sided pneumonia. Have given her vancomycin and Levaquin for her pneumonia given she is allergic to penicillins. She is receiving 30 mL/kg IV fluid bolus and then a rate of normal saline at 125 mL per hour. She has not had any hypotension. She is still mentating normally. We'll discuss with medicine for admission.   3:14 AM Discussed patient's case with hospitalist, Dr. Selena BattenKim.  I have recommended admission and patient (and family if present) agree with this plan. Admitting physician will place admission orders.   I reviewed all nursing notes, vitals, pertinent previous records, EKGs, lab and urine results, imaging (as available).    EKG Interpretation  Date/Time:  Thursday May 02 2017 00:35:39 EDT Ventricular Rate:  91 PR Interval:    QRS Duration: 83 QT Interval:  430 QTC Calculation: 530 R Axis:   69 Text Interpretation:  Sinus rhythm Borderline short PR interval  Probable left atrial enlargement Left ventricular hypertrophy Prolonged QT interval Confirmed by Rochele RaringWard, Phillip Maffei [phone]([id number]) on 05/02/2017 1:26:06 AM        CRITICAL CARE Performed by: Raelyn NumberWARD, Derry Arbogast N   Total critical care time: 45 minutes  Critical care time was exclusive of separately billable procedures and treating other patients.  Critical care was necessary to treat or prevent imminent or life-threatening deterioration.  Critical care was time spent personally by me on the following activities: development of treatment plan with patient and/or surrogate as well as nursing, discussions with consultants, evaluation of patient's response to treatment, examination of patient, obtaining history from patient or surrogate, ordering and performing treatments and interventions, ordering and review of laboratory studies, ordering and review of radiographic studies, pulse oximetry and re-evaluation of patient's condition.    Sulo Janczak, Layla MawKristen N, DO 05/02/17 [id number]

## 2017-05-02 NOTE — Progress Notes (Signed)
Pharmacy Antibiotic Note  Jaclynn Guarnerinnette Surette is a 56 y.o. female admitted on 05/02/2017 with pneumonia.  Pharmacy has been consulted for Vancocin and Levaquin dosing.  Of note pt w/ AKI; baseline SCr 1.1 1wk ago, now 6.5.  Plan: Rec'd vanc 1g and Levaquin 750mg  in ED. Vancomycin maintenance doses after rechecking SCr.  Goal trough 15-20 mcg/mL. Levaquin 500mg  IV Q48H.  Height: 5\' 3"  (160 cm) Weight: 167 lb (75.8 kg) IBW/kg (Calculated) : 52.4  Temp (24hrs), Avg:99.8 F (37.7 C), Min:98.7 F (37.1 C), Max:102.1 F (38.9 C)   Recent Labs Lab 04/26/17 1553 05/01/17 1627 05/02/17 0200 05/02/17 0214 05/02/17 0402  WBC  --  8.1 7.5  --   --   CREATININE 1.10* 5.42* 6.54*  --   --   LATICACIDVEN  --   --   --  1.28 1.05    Estimated Creatinine Clearance: 9.5 mL/min (A) (by C-G formula based on SCr of 6.54 mg/dL (H)).    Allergies  Allergen Reactions  . Penicillins Anaphylaxis    Throat closes  Has patient had a PCN reaction causing immediate rash, facial/tongue/throat swelling, SOB or lightheadedness with hypotension: Yes Has patient had a PCN reaction causing severe rash involving mucus membranes or skin necrosis: Unknown Has patient had a PCN reaction that required hospitalization: Yes Has patient had a PCN reaction occurring within the last 10 years: No If all of the above answers are "NO", then may proceed with Cephalosporin use.      Thank you for allowing pharmacy to be a part of this patient's care.  Vernard GamblesVeronda Lauran Romanski, PharmD, BCPS  05/02/2017 5:03 AM

## 2017-05-02 NOTE — Progress Notes (Signed)
[facility]                                                                              Patient Demographics  Veronica Turner, is a 56 y.o. female, DOB - 1960-12-15, XBM:[id number]  Admit date - 05/02/2017   Admitting Physician No admitting provider for patient encounter.  Outpatient Primary MD for the patient is Patient, No Pcp Per  Outpatient specialists:   LOS - 0  days   Medical records reviewed and are as summarized below:    Chief Complaint  Patient presents with  . Fatigue  . Altered Mental Status  . Cough       Brief summary   Per Dr. Elmyra Ricks admit note on 9/13  Veronica Turner  is a 56 y.o. female, w hypertension, c/o cough for the past 1 week, and dyspnea.  Cough productive yellow sputum.  + fever for the past 1 day. Slight diarrhea, "loose stool" for the past 2 days.   Pt denies cp, palp, n/v, abd pain,  brbpr. Pt had been to urgent care on 9/7 and treated with tessalon pearls.   In ED, CXR showed extensive RUL pneumonia.  Pt wbc 7.5, Bun/creatinine 66/6.54, Glucose 107,  Pt will be admitted for ARF, and pneumonia, and hypokalemia   Assessment & Plan    Principal Problem:   ARF (acute renal failure) (HCC) - Multifactorial, patient had diarrhea in the last 2 days causing dehydration, pneumonia, medications including HCTZ, losartan, has been taking NSAIDs Mobic 7.5 mg twice a day - Continue IV fluid hydration - Hold HCTZ, losartan, NSAIDs - Renal ultrasound showed increased renal echogenicity indicating above medical renal disease, slight fullness of each renal collecting system without any obstructing focus -Creatinine 6.54, trended up from 5.4 yesterday, no lactic acidosis, consulted nephrology, discussed with Dr. Lowell Guitar  - Follow urine sodium, urine creatinine, SPEP, urine eosinophils. Urinalysis showed large hemoglobin but 0-5 RBCs, check CK to rule out rhabdomyolysis  Active Problems:   Hypokalemia -Replaced     Community acquired  pneumonia - Follow urine strep antigen, urine legionella antigen, continue IV Levaquin with renal dosing    Hyponatremia -  Continue IV fluid hydration    Transaminitis With hyperbilirubinemia - Unclear etiology, right upper quadrant ultrasound showed normal appearance of gallbladder and biliary tree, no gallstones or wall thickening no Murphy sign. CBD normal 5 mm. Equally vocal increased hepatic density can be seen with mild steatosis, no focal hepatic lesion - Hepatitis panel in process  Hypertension - Currently BP borderline, continue low-dose Coreg however hold HCTZ, Imdur, losartan, amlodipine   Code Status:Full CODE STATUS  DVT Prophylaxis: Heparin subcutaneous  Family Communication: Discussed in detail with the patient, all imaging results, lab results explained to the patient  Disposition Plan:   Time Spent in minutes   35 minutes  Procedures:  Right upper quadrant ultrasound, renal ultrasound  Consultants:   Nephrology  Antimicrobials:      Medications  Scheduled Meds: . aspirin  81 mg Oral Daily  . carvedilol  12.5 mg Oral BID WC  . heparin  5,000 Units Subcutaneous Q8H   Continuous Infusions: . sodium  chloride 150 mL/hr at 05/02/17 0818  . [START ON 05/04/2017] levofloxacin (LEVAQUIN) IV     PRN Meds:.HYDROcodone-homatropine   Antibiotics   Anti-infectives    Start     Dose/Rate Route Frequency Ordered Stop   05/04/17 0400  levofloxacin (LEVAQUIN) IVPB 500 mg     500 mg 100 mL/hr over 60 Minutes Intravenous Every 48 hours 05/02/17 0509     05/02/17 0130  levofloxacin (LEVAQUIN) IVPB 750 mg     750 mg 100 mL/hr over 90 Minutes Intravenous  Once 05/02/17 0125 05/02/17 0355   05/02/17 0130  vancomycin (VANCOCIN) IVPB 1000 mg/200 mL premix     1,000 mg 200 mL/hr over 60 Minutes Intravenous  Once 05/02/17 0125 05/02/17 0325        Subjective:   Veronica Turner was seen and examined today. Still feels weak otherwise no abdominal pain, nausea or  vomiting.  Patient denies dizziness, chest pain, shortness of breath, D/C, new weakness, numbess, tingling. No fevers  Objective:   Vitals:   05/02/17 1030 05/02/17 1115 05/02/17 1130 05/02/17 1145  BP: 124/74 102/63 107/60 103/65  Pulse: 76 75 72 71  Resp: 16 (!) 24 20 19   Temp:      TempSrc:      SpO2: 95% 98% 97% 96%  Weight:      Height:        Intake/Output Summary (Last 24 hours) at 05/02/17 1210 Last data filed at 05/02/17 0700  Gross per 24 hour  Intake             3850 ml  Output                0 ml  Net             3850 ml     Wt Readings from Last 3 Encounters:  05/02/17 75.8 kg (167 lb)  05/01/17 68 kg (150 lb)  01/27/16 75.5 kg (166 lb 6.4 oz)     Exam  General: Alert and oriented x 3, NAD  Eyes: PERRLA, EOMI  HEENT:  Atraumatic, normocephalic,  Cardiovascular: S1 S2 auscultated, no rubs, murmurs or gallops. Regular rate and rhythm.  Respiratory: Clear to auscultation bilaterally, no wheezing, rales or rhonchi  Gastrointestinal: Soft, nontender, nondistended, + bowel sounds  Ext: no pedal edema bilaterally  Neuro: AAOx3, Cr N's II- XII. Strength 5/5 upper and lower extremities bilaterally  Musculoskeletal: No digital cyanosis, clubbing  Skin: No rashes  Psych: Normal affect and demeanor, alert and oriented x3    Data Reviewed:  I have personally reviewed following labs and imaging studies  Micro Results No results found for this or any previous visit (from the past 240 hour(s)).  Radiology Reports Dg Chest 2 View  Result Date: 05/02/2017 CLINICAL DATA:  Cough and weakness for 2 days. EXAM: CHEST  2 VIEW COMPARISON:  PA and lateral chest 12/15/2015. FINDINGS: There is extensive right upper lobe airspace disease. The left lung is clear. Heart size is normal. No pneumothorax or pleural fluid. IMPRESSION: Extensive right upper lobe airspace disease consistent with pneumonia. Recommend followup to clearing. Electronically Signed   By: Drusilla Kannerhomas   Dalessio M.D.   On: 05/02/2017 01:15   [place] Renal  Result Date: 05/02/2017 CLINICAL DATA:  Acute renal insufficiency EXAM: RENAL / URINARY TRACT ULTRASOUND COMPLETE COMPARISON:  None. FINDINGS: Right Kidney: Length: 14.1 cm. Echogenicity is increased. Renal cortical thickness is within normal limits. No mass or perinephric fluid visualized. There is slight fullness of the  right renal collecting system. No sonographically demonstrable calculus or ureterectasis on either side. Left Kidney: Length: 14.0 cm. Echogenicity is increased. Renal cortical thickness is within normal limits. No mass or perinephric fluid visualized. There is slight fullness of the left renal collecting system. No sonographically demonstrable calculus or ureterectasis. Bladder: Appears normal for degree of bladder distention. Urinary bladder is nearly empty. IMPRESSION: Increased renal echogenicity, a finding that may be indicative of a degree of medical renal disease. Renal cortical thickness is within normal limits. There is slight fullness of each renal collecting system without obstructing focus seen on either side. The etiology for this mild bilateral collecting system fullness is uncertain. Electronically Signed   By: Bretta Bang III M.D.   On: 05/02/2017 08:00   US Abdomen Limited Ruq  Result Date: 05/02/2017 CLINICAL DATA:  Elevated LFTs. EXAM: ULTRASOUND ABDOMEN LIMITED RIGHT UPPER QUADRANT COMPARISON:  None. FINDINGS: Gallbladder: Physiologically distended. No gallstones or wall thickening visualized. No sonographic Murphy sign noted by sonographer. Common bile duct: Diameter: 5 mm, normal. Liver: No focal lesion identified. Equivocal increase in parenchymal echogenicity. Portal vein is patent on color Doppler imaging with normal direction of blood flow towards the liver. IMPRESSION: 1. Normal sonographic appearance of gallbladder and biliary tree. 2. Equivocal increased hepatic density can be seen with mild steatosis. No  focal hepatic lesion. Electronically Signed   By: Rubye Oaks M.D.   On: 05/02/2017 05:42    Lab Data:  CBC:  Recent Labs Lab 04/26/17 1553 05/01/17 1627 05/02/17 0200 05/02/17 1113  WBC  --  8.1 7.5 6.2  NEUTROABS  --   --  6.5  --   HGB 17.0* 14.6 14.6 13.0  HCT 50.0* 42.0 41.9 37.9  MCV  --  89.2 89.5 90.0  PLT  --  181 180 156   Basic Metabolic Panel:  Recent Labs Lab 04/26/17 1553 05/01/17 1627 05/02/17 0200  NA 137 131* 129*  K 3.8 3.3* 3.4*  CL 99* 91* 91*  CO2  --  22 20*  GLUCOSE 105* 114* 107*  BUN 22* 60* 66*  CREATININE 1.10* 5.42* 6.54*  CALCIUM  --  9.1 8.9   GFR: Estimated Creatinine Clearance: 9.5 mL/min (A) (by C-G formula based on SCr of 6.54 mg/dL (H)). Liver Function Tests:  Recent Labs Lab 05/01/17 1627 05/02/17 0200  AST 220* 225*  ALT 95* 96*  ALKPHOS 95 87  BILITOT 4.0* 4.6*  PROT 7.4 7.5  ALBUMIN 3.1* 2.8*    Recent Labs Lab 05/01/17 1627  LIPASE 35   No results for input(s): AMMONIA in the last 168 hours. Coagulation Profile:  Recent Labs Lab 05/02/17 0200  INR 1.05   Cardiac Enzymes: No results for input(s): CKTOTAL, CKMB, CKMBINDEX, TROPONINI in the last 168 hours. BNP (last 3 results) No results for input(s): PROBNP in the last 8760 hours. HbA1C: No results for input(s): HGBA1C in the last 72 hours. CBG: No results for input(s): GLUCAP in the last 168 hours. Lipid Profile: No results for input(s): CHOL, HDL, LDLCALC, TRIG, CHOLHDL, LDLDIRECT in the last 72 hours. Thyroid Function Tests: No results for input(s): TSH, T4TOTAL, FREET4, T3FREE, THYROIDAB in the last 72 hours. Anemia Panel: No results for input(s): VITAMINB12, FOLATE, FERRITIN, TIBC, IRON, RETICCTPCT in the last 72 hours. Urine analysis:    Component Value Date/Time   COLORURINE AMBER (A) 05/02/2017 0325   APPEARANCEUR CLOUDY (A) 05/02/2017 0325   LABSPEC 1.016 05/02/2017 0325   PHURINE 5.0 05/02/2017 0325   GLUCOSEU  NEGATIVE 05/02/2017  0325   HGBUR LARGE (A) 05/02/2017 0325   BILIRUBINUR NEGATIVE 05/02/2017 0325   KETONESUR NEGATIVE 05/02/2017 0325   PROTEINUR 100 (A) 05/02/2017 0325   NITRITE NEGATIVE 05/02/2017 0325   LEUKOCYTESUR NEGATIVE 05/02/2017 0325     Ripudeep Rai M.D. [facility] 05/02/2017, 12:10 PM  Pager: [phone] Between 7am to 7pm - call Pager - [phone]  After 7pm go to [email or web address] - password [email or web address]  Call night coverage person covering after 7pm

## 2017-05-02 NOTE — Consult Note (Signed)
Veronica Turner  is a 56 y.o. female, with a hx of severe hypertension, creat 1.15 on 04/26/17 who c/o anorexia, several days of diarrhea, lightheadedness and vomited today.  She also c/o a cough and increased SOB.  She presented to the [facility] ED yesterday and CXR showed RUL pneumonia, BUN/creatinine was 60/5.42 rising to 6.54 and sodium 129.  Renal US was neg for obstruction.  She was receiving an ARB and Cox-2 inhibitor PTA.  Renal consult was requested to assist.  Past Medical History:  Diagnosis Date  . Chest pain    a. Normal stress test 02/2014.  Marland Kitchen Hypertension   . Obesity   . TIA (transient ischemic attack) 04/20/12  . Tobacco abuse    Past Surgical History:  Procedure Laterality Date  . CESAREAN SECTION    . 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  She has been smoking about 0.15 packs per day. She has never used smokeless tobacco. She reports that she does not drink alcohol or use drugs. Allergies:  Allergies  Allergen Reactions  . Penicillins Anaphylaxis    Throat closes  Has patient had a PCN reaction causing immediate rash, facial/tongue/throat swelling, SOB or lightheadedness with hypotension: Yes Has patient had a PCN reaction causing severe rash involving mucus membranes or skin necrosis: Unknown Has patient had a PCN reaction that required hospitalization: Yes Has patient had a PCN reaction occurring within the last 10 years: No If all of the above answers are "NO", then may proceed with Cephalosporin use.    Family History  Problem Relation Age of Onset  . Heart disease Mother   . Hypertension Mother   . Cancer Father     Medications:  Prior to Admission:  (Not in a hospital admission) Scheduled: . aspirin  81 mg Oral Daily  . carvedilol  3.125 mg Oral BID WC  . heparin  5,000 Units Subcutaneous Q8H    ROS: as per HPI Blood pressure 121/75, pulse 79, temperature (!) 102.1 F (38.9 C),  temperature source Rectal, resp. rate (!) 28, height 5' 3"  (1.6 m), weight 75.8 kg (167 lb), last menstrual period 07/01/2014, SpO2 95 %.  General appearance: alert, cooperative and mild distress Head: Normocephalic, without obvious abnormality, atraumatic Eyes: negative Nose: Nares normal. Septum midline. Mucosa normal. No drainage or sinus tenderness. Throat: lips, mucosa, and tongue normal; teeth and gums normal Chest wall: no tenderness Cardio: regular rate and rhythm GI: soft, non-tender; bowel sounds normal; no masses,  no organomegaly Extremities: extremities normal, atraumatic, no cyanosis or edema Neurologic: NF Results for orders placed or performed during the hospital encounter of 05/02/17 (from the past 48 hour(s))  Sodium, urine, random     Status: None   Collection Time: 05/01/17  1:48 PM  Result Value Ref Range   Sodium, Ur <10 mmol/L  Creatinine, urine, random     Status: None   Collection Time: 05/01/17  1:48 PM  Result Value Ref Range   Creatinine, Urine 316.96 mg/dL  Osmolality, urine     Status: None   Collection Time: 05/01/17  1:48 PM  Result Value Ref Range   Osmolality, Ur 310 300 - 900 mOsm/kg  Comprehensive metabolic panel     Status: Abnormal   Collection Time: 05/02/17  2:00 AM  Result Value Ref Range   Sodium 129 (L) 135 - 145 mmol/L   Potassium 3.4 (L) 3.5 - 5.1 mmol/L  Chloride 91 (L) 101 - 111 mmol/L   CO2 20 (L) 22 - 32 mmol/L   Glucose, Bld 107 (H) 65 - 99 mg/dL   BUN 66 (H) 6 - 20 mg/dL   Creatinine, Ser 6.54 (H) 0.44 - 1.00 mg/dL   Calcium 8.9 8.9 - 10.3 mg/dL   Total Protein 7.5 6.5 - 8.1 g/dL   Albumin 2.8 (L) 3.5 - 5.0 g/dL   AST 225 (H) 15 - 41 U/L   ALT 96 (H) 14 - 54 U/L   Alkaline Phosphatase 87 38 - 126 U/L   Total Bilirubin 4.6 (H) 0.3 - 1.2 mg/dL   GFR calc non Af Amer 6 (L) >60 mL/min   GFR calc Af Amer 7 (L) >60 mL/min    Comment: (NOTE) The eGFR has been calculated using the CKD EPI equation. This calculation has not been  validated in all clinical situations. eGFR's persistently <60 mL/min signify possible Chronic Kidney Disease.    Anion gap 18 (H) 5 - 15  CBC with Differential     Status: None   Collection Time: 05/02/17  2:00 AM  Result Value Ref Range   WBC 7.5 4.0 - 10.5 K/uL   RBC 4.68 3.87 - 5.11 MIL/uL   Hemoglobin 14.6 12.0 - 15.0 g/dL   HCT 41.9 36.0 - 46.0 %   MCV 89.5 78.0 - 100.0 fL   MCH 31.2 26.0 - 34.0 pg   MCHC 34.8 30.0 - 36.0 g/dL   RDW 13.8 11.5 - 15.5 %   Platelets 180 150 - 400 K/uL   Neutrophils Relative % 86 %   Neutro Abs 6.5 1.7 - 7.7 K/uL   Lymphocytes Relative 11 %   Lymphs Abs 0.8 0.7 - 4.0 K/uL   Monocytes Relative 3 %   Monocytes Absolute 0.2 0.1 - 1.0 K/uL   Eosinophils Relative 0 %   Eosinophils Absolute 0.0 0.0 - 0.7 K/uL   Basophils Relative 0 %   Basophils Absolute 0.0 0.0 - 0.1 K/uL  Protime-INR     Status: None   Collection Time: 05/02/17  2:00 AM  Result Value Ref Range   Prothrombin Time 13.6 11.4 - 15.2 seconds   INR 1.05   I-Stat CG4 Lactic Acid, ED     Status: None   Collection Time: 05/02/17  2:14 AM  Result Value Ref Range   Lactic Acid, Venous 1.28 0.5 - 1.9 mmol/L  Urinalysis, Routine w reflex microscopic     Status: Abnormal   Collection Time: 05/02/17  3:25 AM  Result Value Ref Range   Color, Urine AMBER (A) YELLOW    Comment: BIOCHEMICALS MAY BE AFFECTED BY COLOR   APPearance CLOUDY (A) CLEAR   Specific Gravity, Urine 1.016 1.005 - 1.030   pH 5.0 5.0 - 8.0   Glucose, UA NEGATIVE NEGATIVE mg/dL   Hgb urine dipstick LARGE (A) NEGATIVE   Bilirubin Urine NEGATIVE NEGATIVE   Ketones, ur NEGATIVE NEGATIVE mg/dL   Protein, ur 100 (A) NEGATIVE mg/dL   Nitrite NEGATIVE NEGATIVE   Leukocytes, UA NEGATIVE NEGATIVE   RBC / HPF 0-5 0 - 5 RBC/hpf   WBC, UA NONE SEEN 0 - 5 WBC/hpf   Bacteria, UA RARE (A) NONE SEEN   Squamous Epithelial / LPF 0-5 (A) NONE SEEN  I-Stat CG4 Lactic Acid, ED     Status: None   Collection Time: 05/02/17  4:02 AM   Result Value Ref Range   Lactic Acid, Venous 1.05 0.5 - 1.9 mmol/L  Creatinine, serum     Status: Abnormal   Collection Time: 05/02/17 11:13 AM  Result Value Ref Range   Creatinine, Ser 6.32 (H) 0.44 - 1.00 mg/dL   GFR calc non Af Amer 7 (L) >60 mL/min   GFR calc Af Amer 8 (L) >60 mL/min    Comment: (NOTE) The eGFR has been calculated using the CKD EPI equation. This calculation has not been validated in all clinical situations. eGFR's persistently <60 mL/min signify possible Chronic Kidney Disease.   CBC     Status: None   Collection Time: 05/02/17 11:13 AM  Result Value Ref Range   WBC 6.2 4.0 - 10.5 K/uL   RBC 4.21 3.87 - 5.11 MIL/uL   Hemoglobin 13.0 12.0 - 15.0 g/dL   HCT 37.9 36.0 - 46.0 %   MCV 90.0 78.0 - 100.0 fL   MCH 30.9 26.0 - 34.0 pg   MCHC 34.3 30.0 - 36.0 g/dL   RDW 13.9 11.5 - 15.5 %   Platelets 156 150 - 400 K/uL  Troponin I (q 6hr x 3)     Status: Abnormal   Collection Time: 05/02/17 11:13 AM  Result Value Ref Range   Troponin I 0.08 (HH) <0.03 ng/mL    Comment: CRITICAL RESULT CALLED TO, READ BACK BY AND VERIFIED WITH: CLEMMER,C RN @ 1311 05/02/17 LEOANRD,A   CK total and CKMB (cardiac)not at [facility]     Status: Abnormal   Collection Time: 05/02/17 11:13 AM  Result Value Ref Range   Total CK 1,636 (H) 38 - 234 U/L   CK, MB 11.8 (H) 0.5 - 5.0 ng/mL   Relative Index 0.7 0.0 - 2.5  Cortisol     Status: None   Collection Time: 05/02/17  1:18 PM  Result Value Ref Range   Cortisol, Plasma 37.0 ug/dL    Comment: (NOTE) AM    6.7 - 22.6 ug/dL PM   <10.0       ug/dL   CK     Status: Abnormal   Collection Time: 05/02/17  1:18 PM  Result Value Ref Range   Total CK 1,621 (H) 38 - 234 U/L  TSH     Status: None   Collection Time: 05/02/17  1:18 PM  Result Value Ref Range   TSH 0.625 0.350 - 4.500 uIU/mL    Comment: Performed by a 3rd Generation assay with a functional sensitivity of <=0.01 uIU/mL.   Dg Chest 2 View  Result Date: 05/02/2017 CLINICAL DATA:   Cough and weakness for 2 days. EXAM: CHEST  2 VIEW COMPARISON:  PA and lateral chest 12/15/2015. FINDINGS: There is extensive right upper lobe airspace disease. The left lung is clear. Heart size is normal. No pneumothorax or pleural fluid. IMPRESSION: Extensive right upper lobe airspace disease consistent with pneumonia. Recommend followup to clearing. Electronically Signed   By: Inge Rise M.D.   On: 05/02/2017 01:15   US Renal  Result Date: 05/02/2017 CLINICAL DATA:  Acute renal insufficiency EXAM: RENAL / URINARY TRACT ULTRASOUND COMPLETE COMPARISON:  None. FINDINGS: Right Kidney: Length: 14.1 cm. Echogenicity is increased. Renal cortical thickness is within normal limits. No mass or perinephric fluid visualized. There is slight fullness of the right renal collecting system. No sonographically demonstrable calculus or ureterectasis on either side. Left Kidney: Length: 14.0 cm. Echogenicity is increased. Renal cortical thickness is within normal limits. No mass or perinephric fluid visualized. There is slight fullness of the left renal collecting system. No sonographically demonstrable calculus or ureterectasis.  Bladder: Appears normal for degree of bladder distention. Urinary bladder is nearly empty. IMPRESSION: Increased renal echogenicity, a finding that may be indicative of a degree of medical renal disease. Renal cortical thickness is within normal limits. There is slight fullness of each renal collecting system without obstructing focus seen on either side. The etiology for this mild bilateral collecting system fullness is uncertain. Electronically Signed   By: Lowella Grip III M.D.   On: 05/02/2017 08:00   US Abdomen Limited Ruq  Result Date: 05/02/2017 CLINICAL DATA:  Elevated LFTs. EXAM: ULTRASOUND ABDOMEN LIMITED RIGHT UPPER QUADRANT COMPARISON:  None. FINDINGS: Gallbladder: Physiologically distended. No gallstones or wall thickening visualized. No sonographic Murphy sign noted by  sonographer. Common bile duct: Diameter: 5 mm, normal. Liver: No focal lesion identified. Equivocal increase in parenchymal echogenicity. Portal vein is patent on color Doppler imaging with normal direction of blood flow towards the liver. IMPRESSION: 1. Normal sonographic appearance of gallbladder and biliary tree. 2. Equivocal increased hepatic density can be seen with mild steatosis. No focal hepatic lesion. Electronically Signed   By: Jeb Levering M.D.   On: 05/02/2017 05:42    Assessment:  1 Oliguric AKI, due to hypovolemia, due to GI losses, with ARB and Cox-2/NSAID(and relative hypotension), PNA 2 Pneumonia 3 Severe hypertension hx 4 Hyponatremia, hypovolemic variety  Plan: 1 Volume expand with saline 2 Hold ARB and COX-2 3 I told her kidney fct will likely worsen before it improves  Estanislado Emms 05/02/2017, 4:15 PM

## 2017-05-02 NOTE — ED Triage Notes (Signed)
Per EMS, pt from home. Monday afternoon pt's family member noticed she wasn't acting like herself. Pt was lethargic with some confusion. LSN 04/30/17 1500. Per EMS neuro intact, lung sounds diminished with rhonchi. EMS gave 5mg  albuterol tx. Pt reports a productive cough past few weeks. Pt warm to touch, A&Ox4, alert to voice, difficult to hold conversation with. Hx HTN. EMS VS BP 131/79, NSR HR 90, 89% on room air, 99% on 2L. CBG 111.

## 2017-05-02 NOTE — Progress Notes (Signed)
Jaclynn Guarnerinnette Salvas is a 56 y.o. female patient admitted from ED awake, with altered mental status - on enteric precautions.  VSS - Blood pressure 131/80, pulse 79, temperature 98.5 F (36.9 C), temperature source Oral, resp. rate (!) 22, height 5\' 3"  (1.6 m), weight 72.6 kg (160 lb), last menstrual period 07/01/2014, SpO2 96 %.    IV in place, occlusive dsg intact without redness.  Orientation to room, and floor completed with information packet given to patient/family.  Patient declined safety video at this time.  Admission INP armband ID verified with patient/family, and in place.   SR up x 2, fall assessment complete, with patient and family able to verbalize understanding of risk associated with falls, and verbalized understanding to call nsg before up out of bed.  Call light within reach, patient able to voice, and demonstrate understanding.  Skin, clean-dry- intact without evidence of bruising, or skin tears.   No evidence of skin break down noted on exam.     Will cont to eval and treat per MD orders.  Evern BioLoren D Orel Hord, RN 05/02/2017 7:36 PM

## 2017-05-02 NOTE — ED Notes (Signed)
Patient transported to Ultrasound 

## 2017-05-03 ENCOUNTER — Inpatient Hospital Stay (HOSPITAL_COMMUNITY): Payer: Self-pay

## 2017-05-03 ENCOUNTER — Other Ambulatory Visit (HOSPITAL_COMMUNITY): Payer: No Typology Code available for payment source

## 2017-05-03 DIAGNOSIS — R9431 Abnormal electrocardiogram [ECG] [EKG]: Secondary | ICD-10-CM

## 2017-05-03 DIAGNOSIS — R079 Chest pain, unspecified: Secondary | ICD-10-CM

## 2017-05-03 DIAGNOSIS — R748 Abnormal levels of other serum enzymes: Secondary | ICD-10-CM

## 2017-05-03 LAB — PROTEIN ELECTROPHORESIS, SERUM
A/G Ratio: 0.6 — ABNORMAL LOW (ref 0.7–1.7)
Albumin ELP: 1.9 g/dL — ABNORMAL LOW (ref 2.9–4.4)
Alpha-1-Globulin: 0.4 g/dL (ref 0.0–0.4)
Alpha-2-Globulin: 1.3 g/dL — ABNORMAL HIGH (ref 0.4–1.0)
BETA GLOBULIN: 0.8 g/dL (ref 0.7–1.3)
GAMMA GLOBULIN: 0.6 g/dL (ref 0.4–1.8)
Globulin, Total: 3.2 g/dL (ref 2.2–3.9)
TOTAL PROTEIN ELP: 5.1 g/dL — AB (ref 6.0–8.5)

## 2017-05-03 LAB — ECHOCARDIOGRAM COMPLETE
Ao-asc: 35 cm
CHL CUP MV DEC (S): 257
CHL CUP TV REG PEAK VELOCITY: 270 cm/s
EWDT: 257 ms
FS: 27 % — AB (ref 28–44)
Height: 63 in
IV/PV OW: 0.91
LA diam index: 1.76 cm/m2
LA vol index: 28.6 mL/m2
LA vol: 52 mL
LASIZE: 32 mm
LAVOLA4C: 43.5 mL
LEFT ATRIUM END SYS DIAM: 32 mm
LV PW d: 11 mm — AB (ref 0.6–1.1)
LV TDI E'LATERAL: 15.9
LV TDI E'MEDIAL: 8.38
LVELAT: 15.9 cm/s
LVOT area: 3.14 cm2
LVOT diameter: 20 mm
MV pk E vel: 0.9 m/s
RV LATERAL S' VELOCITY: 15.4 cm/s
TAPSE: 28.5 mm
TR max vel: 270 cm/s
WEIGHTICAEL: 2560 [oz_av]

## 2017-05-03 LAB — TROPONIN I: TROPONIN I: 0.05 ng/mL — AB (ref <0.03)

## 2017-05-03 LAB — HEPATITIS PANEL, ACUTE
Hep A IgM: NEGATIVE
Hep B C IgM: NEGATIVE
Hepatitis B Surface Ag: NEGATIVE

## 2017-05-03 LAB — CBC
HEMATOCRIT: 36.5 % (ref 36.0–46.0)
Hemoglobin: 12.6 g/dL (ref 12.0–15.0)
MCH: 30.7 pg (ref 26.0–34.0)
MCHC: 34.5 g/dL (ref 30.0–36.0)
MCV: 88.8 fL (ref 78.0–100.0)
PLATELETS: 186 10*3/uL (ref 150–400)
RBC: 4.11 MIL/uL (ref 3.87–5.11)
RDW: 14 % (ref 11.5–15.5)
WBC: 7.9 10*3/uL (ref 4.0–10.5)

## 2017-05-03 LAB — COMPREHENSIVE METABOLIC PANEL
ALBUMIN: 2.1 g/dL — AB (ref 3.5–5.0)
ALK PHOS: 104 U/L (ref 38–126)
ALT: 80 U/L — ABNORMAL HIGH (ref 14–54)
ANION GAP: 15 (ref 5–15)
AST: 145 U/L — ABNORMAL HIGH (ref 15–41)
BILIRUBIN TOTAL: 2.2 mg/dL — AB (ref 0.3–1.2)
BUN: 72 mg/dL — ABNORMAL HIGH (ref 6–20)
CO2: 15 mmol/L — ABNORMAL LOW (ref 22–32)
Calcium: 8.8 mg/dL — ABNORMAL LOW (ref 8.9–10.3)
Chloride: 100 mmol/L — ABNORMAL LOW (ref 101–111)
Creatinine, Ser: 6.82 mg/dL — ABNORMAL HIGH (ref 0.44–1.00)
GFR, EST AFRICAN AMERICAN: 7 mL/min — AB (ref >60)
GFR, EST NON AFRICAN AMERICAN: 6 mL/min — AB (ref >60)
GLUCOSE: 85 mg/dL (ref 65–99)
POTASSIUM: 3.4 mmol/L — AB (ref 3.5–5.1)
Sodium: 130 mmol/L — ABNORMAL LOW (ref 135–145)
TOTAL PROTEIN: 6.5 g/dL (ref 6.5–8.1)

## 2017-05-03 LAB — URINE CULTURE: Culture: NO GROWTH

## 2017-05-03 LAB — HIV ANTIBODY (ROUTINE TESTING W REFLEX): HIV SCREEN 4TH GENERATION: NONREACTIVE

## 2017-05-03 LAB — MRSA PCR SCREENING: MRSA BY PCR: NEGATIVE

## 2017-05-03 LAB — CK: Total CK: 807 U/L — ABNORMAL HIGH (ref 38–234)

## 2017-05-03 MED ORDER — SODIUM CHLORIDE 0.9 % IV SOLN
INTRAVENOUS | Status: DC
Start: 2017-05-03 — End: 2017-05-03

## 2017-05-03 MED ORDER — SODIUM BICARBONATE 650 MG PO TABS
1300.0000 mg | ORAL_TABLET | Freq: Two times a day (BID) | ORAL | Status: DC
  Administered 2017-05-03 – 2017-05-04 (×4): 1300 mg via ORAL
  Filled 2017-05-03 (×4): qty 2

## 2017-05-03 MED ORDER — POTASSIUM CHLORIDE CRYS ER 20 MEQ PO TBCR
40.0000 meq | EXTENDED_RELEASE_TABLET | Freq: Once | ORAL | Status: AC
  Administered 2017-05-03: 40 meq via ORAL
  Filled 2017-05-03: qty 2

## 2017-05-03 MED ORDER — AZITHROMYCIN 500 MG PO TABS: 250.0000 mg | ORAL_TABLET | Freq: Every day | ORAL | Status: DC

## 2017-05-03 MED ORDER — AZITHROMYCIN 500 MG PO TABS
500.0000 mg | ORAL_TABLET | Freq: Once | ORAL | Status: AC
  Administered 2017-05-04: 500 mg via ORAL
  Filled 2017-05-03: qty 1

## 2017-05-03 NOTE — ED Notes (Signed)
Bedside report given to Minerva Ends, RN on 5W.

## 2017-05-03 NOTE — Progress Notes (Signed)
  Echocardiogram 2D Echocardiogram has been performed.  Akon Reinoso G Syesha Thaw 05/03/2017, 11:37 AM

## 2017-05-03 NOTE — Progress Notes (Signed)
Pt is refusing to wear telemetry at this time. Pt educated and pt said that she will put it back on later after she gets a shower. MD made aware. Will continue to assess.

## 2017-05-03 NOTE — Progress Notes (Signed)
[facility] Progress Note    Assessment/ Plan:   1. Community-acquired pneumonia: on IV levaquin.  Cultures pending.  Per primary.  Got 1 g IV vanc yesterday, pharmacy following.  Per primary   2.  Diarrhea: supportive, C diff pending  3.  Acute kidney injury: multifactorial- was on HCTZ, ARB, and COX-2 inhibitor PTA.  Agree with volume expansion.  Continue to follow.  Pt says she's urinating so will do strict I/O.  Hopefully we can avoid dialysis during this admission.  Renal US without obstruction.  4.  Hypertension: holding BP meds  5.  Metabolic acidosis: likely from AKI.  Sodium Bicarb  6  Hypokalemia: supplementing  7.  Elevated LFTs: RUQ U/S without cholecystitis  8.  Hyponatremia: likely hypovolemic (esp with HCTZ PTA).  Expect to improve with vol expansion/ cessation of HCTZ/ treatment of PNA.   Subjective:    Feeling a little bit better than yesterday.     Objective:   BP (!) 142/82   Pulse 84   Temp 98.4 F (36.9 C) (Oral)   Resp 20   Ht  (1.6 m)   Wt 72.6 kg (160 lb)   LMP 07/01/2014   SpO2 97%   BMI 28.34 kg/m   Intake/Output Summary (Last 24 hours) at 05/03/17 1139 Last data filed at 05/03/17 0440  Gross per 24 hour  Intake                0 ml  Output                0 ml  Net                0 ml   Weight change: -3.175 kg (-7 lb)  Physical Exam:  GEN ill-appearing NAD HEENT EOMI, PERRL NECK no JVD PULM R sided anterior crackles, posterior as well.  L lung clear CV RRR no m/r/g ABD benign EXT no LE edema NEURO AAO x 3   Imaging: Dg Chest 2 View  Result Date: 05/02/2017 CLINICAL DATA:  Cough and weakness for 2 days. EXAM: CHEST  2 VIEW COMPARISON:  PA and lateral chest 12/15/2015. FINDINGS: There is extensive right upper lobe airspace disease. The left lung is clear. Heart size is normal. No pneumothorax or pleural fluid. IMPRESSION: Extensive right upper lobe airspace disease consistent with pneumonia. Recommend followup  to clearing. Electronically Signed   By: Drusilla Kanner M.D.   On: 05/02/2017 01:15   US Renal  Result Date: 05/02/2017 CLINICAL DATA:  Acute renal insufficiency EXAM: RENAL / URINARY TRACT ULTRASOUND COMPLETE COMPARISON:  None. FINDINGS: Right Kidney: Length: 14.1 cm. Echogenicity is increased. Renal cortical thickness is within normal limits. No mass or perinephric fluid visualized. There is slight fullness of the right renal collecting system. No sonographically demonstrable calculus or ureterectasis on either side. Left Kidney: Length: 14.0 cm. Echogenicity is increased. Renal cortical thickness is within normal limits. No mass or perinephric fluid visualized. There is slight fullness of the left renal collecting system. No sonographically demonstrable calculus or ureterectasis. Bladder: Appears normal for degree of bladder distention. Urinary bladder is nearly empty. IMPRESSION: Increased renal echogenicity, a finding that may be indicative of a degree of medical renal disease. Renal cortical thickness is within normal limits. There is slight fullness of each renal collecting system without obstructing focus seen on either side. The etiology for this mild bilateral collecting system fullness is uncertain. Electronically Signed   By: Bretta Bang III M.D.  On: 05/02/2017 08:00   US Abdomen Limited Ruq  Result Date: 05/02/2017 CLINICAL DATA:  Elevated LFTs. EXAM: ULTRASOUND ABDOMEN LIMITED RIGHT UPPER QUADRANT COMPARISON:  None. FINDINGS: Gallbladder: Physiologically distended. No gallstones or wall thickening visualized. No sonographic Murphy sign noted by sonographer. Common bile duct: Diameter: 5 mm, normal. Liver: No focal lesion identified. Equivocal increase in parenchymal echogenicity. Portal vein is patent on color Doppler imaging with normal direction of blood flow towards the liver. IMPRESSION: 1. Normal sonographic appearance of gallbladder and biliary tree. 2. Equivocal increased  hepatic density can be seen with mild steatosis. No focal hepatic lesion. Electronically Signed   By: Rubye Oaks M.D.   On: 05/02/2017 05:42    Labs: BMET  Recent Labs Lab 04/26/17 1553 05/01/17 1627 05/02/17 0200 05/02/17 1113 05/03/17 0720  NA 137 131* 129*  --  130*  K 3.8 3.3* 3.4*  --  3.4*  CL 99* 91* 91*  --  100*  CO2  --  22 20*  --  15*  GLUCOSE 105* 114* 107*  --  85  BUN 22* 60* 66*  --  72*  CREATININE 1.10* 5.42* 6.54* 6.32* 6.82*  CALCIUM  --  9.1 8.9  --  8.8*   CBC  Recent Labs Lab 05/01/17 1627 05/02/17 0200 05/02/17 1113 05/03/17 0720  WBC 8.1 7.5 6.2 7.9  NEUTROABS  --  6.5  --   --   HGB 14.6 14.6 13.0 12.6  HCT 42.0 41.9 37.9 36.5  MCV 89.2 89.5 90.0 88.8  PLT 181 180 156 186    Medications:    . aspirin  81 mg Oral Daily  . carvedilol  3.125 mg Oral BID WC  . heparin  5,000 Units Subcutaneous Q8H  . sodium bicarbonate  1,300 mg Oral BID      Bufford Buttner, MD [facility] pgr [phone] 05/03/2017, 11:39 AM

## 2017-05-03 NOTE — Consult Note (Signed)
Cardiology Consultation:   Patient ID: Veronica Turner; [id number]; [birth year]   Admit date: 05/02/2017 Date of Consult: 05/03/2017  Primary Care Provider: Patient, No Pcp Per Primary Cardiologist: Dr. Jens Som Primary Electrophysiologist:     Patient Profile:   Veronica Turner is a 56 y.o. female with a hx of HTN, TIA, smoker, chest pain s/p normal stress test in 02/2014 who was admitted for CAP and is being seen today for the evaluation of elevated troponin at the request of Dr. Isidoro Donning.  History of Present Illness:   Veronica Turner is known to this service and last saw Dr. Jens Som in clinic on 09/15/15 for evaluation of HTN. He was admitted to [facility] and underwent nuclear stress test 02/2014 which showed no reversible ischemia and normal EF.  At that clinic visit, medications were titrated for better pressure control.  She has not followed up with our office since.  She presented to urgent care with a cough on 04/26/17 and was treated with tessalon pearls. She presented to [facility] on [facility]m for unresolved productive cough with yellow sputum x 1 week and dyspnea. CXR in ED showed extensive RUL PNA. She was admitted to Medicine service with acute renal failure, PNA, and hypokalemia.  Cardiology was consulted for elevated troponin.   On my interview, she states that she has had chest pain off and on for 6 months. Seh has had some personal turmoil this year and has "run myself ragged."  The pain occurred when she was stressed about a problem and was relieved when she found a solution to the problem. The pain was described as a sharp burning pain in the center of her chest with no associated symptoms. The pain lasted about 30-60 sec and occurred three times a week. She reports DOE when doing chores around the house. The shortness of breath started over the past few weeks and she thought this was due to her URI.  She states she has not had the chest pain since March 2018 and that the stress in her life has been  largely resolved. She has not had chest pain during this hospitalization. She denies palpitations. She reports dizziness with the DOE that occurs with activities around the house. She has risk factors for ACS, including current smoker, obesity, and HTN.    Past Medical History:  Diagnosis Date  . Chest pain    a. Normal stress test 02/2014.  Marland Kitchen Hypertension   . Obesity   . TIA (transient ischemic attack) 04/20/12  . Tobacco abuse     Past Surgical History:  Procedure Laterality Date  . CESAREAN SECTION    . cyst on thyroid Bilateral    As infant     Home Medications:  Prior to Admission medications   Medication Sig Start Date End Date Taking? Authorizing Provider  amLODipine (NORVASC) 5 MG tablet Take 1 tablet (5 mg total) by mouth daily. 04/26/17  Yes Yu, Amy V, PA-C  aspirin 81 MG tablet Take 1 tablet (81 mg total) by mouth daily. 07/01/14  Yes Ambrose Finland, NP  benzonatate (TESSALON) 100 MG capsule Take 1 capsule (100 mg total) by mouth every 8 (eight) hours. 04/26/17  Yes Yu, Amy V, PA-C  carvedilol (COREG) 12.5 MG tablet Take 1 tablet (12.5 mg total) by mouth 2 (two) times daily with a meal. 04/26/17  Yes Yu, Amy V, PA-C  fluticasone (FLONASE) 50 MCG/ACT nasal spray Place 2 sprays into both nostrils daily. 04/26/17  Yes Yu, Amy V, PA-C  hydrochlorothiazide (  HYDRODIURIL) 25 MG tablet Take 1 tablet (25 mg total) by mouth daily. 04/26/17  Yes Yu, Amy V, PA-C  isosorbide mononitrate (IMDUR) 60 MG 24 hr tablet Take 1 tablet (60 mg total) by mouth daily. 04/26/17  Yes Yu, Amy V, PA-C  losartan (COZAAR) 100 MG tablet Take 1 tablet (100 mg total) by mouth daily. 04/26/17  Yes Yu, Amy V, PA-C  meloxicam (MOBIC) 7.5 MG tablet Take one by mouth twice daily for hip pain Patient taking differently: Take 7.5 mg by mouth 2 (two) times daily.  01/27/16  Yes Jaclyn Shaggy, MD    Inpatient Medications: Scheduled Meds: . aspirin  81 mg Oral Daily  . carvedilol  3.125 mg Oral BID WC  . heparin  5,000 Units  Subcutaneous Q8H   Continuous Infusions: . sodium chloride Stopped (05/03/17 0915)  . [START ON 05/04/2017] levofloxacin (LEVAQUIN) IV     PRN Meds: HYDROcodone-homatropine  Allergies:    Allergies  Allergen Reactions  . Penicillins Anaphylaxis    Throat closes  Has patient had a PCN reaction causing immediate rash, facial/tongue/throat swelling, SOB or lightheadedness with hypotension: Yes Has patient had a PCN reaction causing severe rash involving mucus membranes or skin necrosis: Unknown Has patient had a PCN reaction that required hospitalization: Yes Has patient had a PCN reaction occurring within the last 10 years: No If all of the above answers are "NO", then may proceed with Cephalosporin use.     Social History:   Social History   Social History  . Marital status: Married    Spouse name: N/A  . Number of children: N/A  . Years of education: N/A   Occupational History  . Not on file.   Social History Main Topics  . Smoking status: Current Every Day Smoker    Packs/day: 0.15    Types: Cigarettes  . Smokeless tobacco: Never Used     Comment: Smoking 2 cigs per day  . Alcohol use No  . Drug use: No  . Sexual activity: Not on file   Other Topics Concern  . Not on file   Social History Narrative  . No narrative on file    Family History:    Family History  Problem Relation Age of Onset  . Heart disease Mother   . Hypertension Mother   . Cancer Father      ROS:  Please see the history of present illness.  ROS  All other ROS reviewed and negative.     Physical Exam/Data:   Vitals:   05/02/17 1500 05/02/17 1632 05/03/17 0424 05/03/17 0912  BP: 121/75 131/80 103/82 (!) 142/82  Pulse: 79 79 80 84  Resp: (!) 28 (!) 22 20   Temp:  98.5 F (36.9 C) 98.4 F (36.9 C)   TempSrc:  Oral Oral   SpO2: 95% 96% 97%   Weight:  160 lb (72.6 kg)    Height:   (1.6 m)      Intake/Output Summary (Last 24 hours) at 05/03/17 1118 Last data filed at  05/03/17 0440  Gross per 24 hour  Intake                0 ml  Output                0 ml  Net                0 ml   Filed Weights   05/02/17 0039 05/02/17 1632  Weight: 167  lb (75.8 kg) 160 lb (72.6 kg)   Body mass index is 28.34 kg/m.  General:  Well nourished, well developed, laying in bed in pain HEENT: normal Neck: no JVD Vascular: No carotid bruits; FA pulses 2+ bilaterally without bruits  Cardiac:  normal S1, S2; RRR; no murmur  Lungs:  Rhonchi throughout, breathing unlabored Abd: soft, nontender, no hepatomegaly  Ext: no edema Musculoskeletal:  No deformities, BUE and BLE strength normal and equal Skin: warm and dry  Neuro:  CNs 2-12 intact, no focal abnormalities noted Psych:  Normal affect   EKG:  The EKG was personally reviewed and demonstrates:  Sinus rhythm, biphasic p wave, LVH Telemetry:  Telemetry was personally reviewed and demonstrates:  NSR  Relevant CV Studies:  Echocardiogram 05/03/17: Study Conclusions - Left ventricle: The cavity size was normal. Systolic function was   normal. The estimated ejection fraction was in the range of 50%   to 55%. Wall motion was normal; there were no regional wall   motion abnormalities. Doppler parameters are consistent with   abnormal left ventricular relaxation (grade 1 diastolic   dysfunction). Doppler parameters are consistent with   indeterminate ventricular filling pressure. - Aortic valve: Transvalvular velocity was within the normal range.   There was no stenosis. There was no regurgitation. - Mitral valve: Transvalvular velocity was within the normal range.   There was no evidence for stenosis. There was no regurgitation. - Right ventricle: The cavity size was normal. Wall thickness was   normal. Systolic function was normal. - Tricuspid valve: There was trivial regurgitation. - Pulmonary arteries: Systolic pressure was within the normal   range. PA peak pressure: 32 mm Hg (S).     Laboratory  Data:  Chemistry Recent Labs Lab 05/01/17 1627 05/02/17 0200 05/02/17 1113 05/03/17 0720  NA 131* 129*  --  130*  K 3.3* 3.4*  --  3.4*  CL 91* 91*  --  100*  CO2 22 20*  --  15*  GLUCOSE 114* 107*  --  85  BUN 60* 66*  --  72*  CREATININE 5.42* 6.54* 6.32* 6.82*  CALCIUM 9.1 8.9  --  8.8*  GFRNONAA 8* 6* 7* 6*  GFRAA 9* 7* 8* 7*  ANIONGAP 18* 18*  --  15     Recent Labs Lab 05/01/17 1627 05/02/17 0200 05/03/17 0720  PROT 7.4 7.5 6.5  ALBUMIN 3.1* 2.8* 2.1*  AST 220* 225* 145*  ALT 95* 96* 80*  ALKPHOS 95 87 104  BILITOT 4.0* 4.6* 2.2*   Hematology Recent Labs Lab 05/02/17 0200 05/02/17 1113 05/03/17 0720  WBC 7.5 6.2 7.9  RBC 4.68 4.21 4.11  HGB 14.6 13.0 12.6  HCT 41.9 37.9 36.5  MCV 89.5 90.0 88.8  MCH 31.2 30.9 30.7  MCHC 34.8 34.3 34.5  RDW 13.8 13.9 14.0  PLT 180 156 186   Cardiac Enzymes Recent Labs Lab 05/02/17 1113 05/02/17 1635 05/02/17 2024 05/03/17 0720  TROPONINI 0.08* 0.08* 0.23* 0.05*   No results for input(s): TROPIPOC in the last 168 hours.  BNPNo results for input(s): BNP, PROBNP in the last 168 hours.  DDimer No results for input(s): DDIMER in the last 168 hours.  Radiology/Studies:  Dg Chest 2 View  Result Date: 05/02/2017 CLINICAL DATA:  Cough and weakness for 2 days. EXAM: CHEST  2 VIEW COMPARISON:  PA and lateral chest 12/15/2015. FINDINGS: There is extensive right upper lobe airspace disease. The left lung is clear. Heart size is normal. No pneumothorax or  pleural fluid. IMPRESSION: Extensive right upper lobe airspace disease consistent with pneumonia. Recommend followup to clearing. Electronically Signed   By: Drusilla Kanner M.D.   On: 05/02/2017 01:15   US Renal  Result Date: 05/02/2017 CLINICAL DATA:  Acute renal insufficiency EXAM: RENAL / URINARY TRACT ULTRASOUND COMPLETE COMPARISON:  None. FINDINGS: Right Kidney: Length: 14.1 cm. Echogenicity is increased. Renal cortical thickness is within normal limits. No mass or  perinephric fluid visualized. There is slight fullness of the right renal collecting system. No sonographically demonstrable calculus or ureterectasis on either side. Left Kidney: Length: 14.0 cm. Echogenicity is increased. Renal cortical thickness is within normal limits. No mass or perinephric fluid visualized. There is slight fullness of the left renal collecting system. No sonographically demonstrable calculus or ureterectasis. Bladder: Appears normal for degree of bladder distention. Urinary bladder is nearly empty. IMPRESSION: Increased renal echogenicity, a finding that may be indicative of a degree of medical renal disease. Renal cortical thickness is within normal limits. There is slight fullness of each renal collecting system without obstructing focus seen on either side. The etiology for this mild bilateral collecting system fullness is uncertain. Electronically Signed   By: Bretta Bang III M.D.   On: 05/02/2017 08:00   US Abdomen Limited Ruq  Result Date: 05/02/2017 CLINICAL DATA:  Elevated LFTs. EXAM: ULTRASOUND ABDOMEN LIMITED RIGHT UPPER QUADRANT COMPARISON:  None. FINDINGS: Gallbladder: Physiologically distended. No gallstones or wall thickening visualized. No sonographic Murphy sign noted by sonographer. Common bile duct: Diameter: 5 mm, normal. Liver: No focal lesion identified. Equivocal increase in parenchymal echogenicity. Portal vein is patent on color Doppler imaging with normal direction of blood flow towards the liver. IMPRESSION: 1. Normal sonographic appearance of gallbladder and biliary tree. 2. Equivocal increased hepatic density can be seen with mild steatosis. No focal hepatic lesion. Electronically Signed   By: Rubye Oaks M.D.   On: 05/02/2017 05:42    Assessment and Plan:   1. Elevated troponin - troponin:  0.08 --> 0.08 --> 0.23 --> 0.05 -EKG sinus, biphasic p wave, LVH - this elevated in troponin is likely secondary to her PNA and ARF She only reports DOE,  which could be her anginal equivalent; she denies recent chest pain. Troponin peaked at 0.23 and trended down. Echocardiogram with normal function, but grade 1 DD, no regional wall motion abnormalities. Will consider myoview stress test when she improves clinically. Given her current kidney function, she is not a cath candidate at this time and we do not think she needs emergent intervention now.    2. Acute renal failure, hypokalemia, hyponatremia - sCr > 6 on admission - nephrology consulted   3. HTN - home meds include norvasc, coreg, HCTZ, imdur, and losartan - pressure has been labile, between 100-140s - low dose coreg has been started, titrate as needed   4. Chronic diastolic heart failure - Grade 1 DD - not volume up on exam - fluids running for infection   For questions or updates, please contact [facility] Please consult [email or web address] for contact info under Cardiology/STEMI.   Signed, Marcelino Duster, PA  05/03/2017 11:18 AM   I have seen and examined the patient along with Marcelino Duster, PA .  I have reviewed the chart, notes and new data.  I agree with PA's note.  Key new complaints: Dyspnea, fatigue Key examination changes: Normal cardiovascular exam Key new findings / data: Minimal elevation in troponin without corresponding changes on ECG or wall motion abnormalities on echo.  ECG shows prolonged QT interval that can be explained by her metabolic abnormalities. Surprisingly normal white blood cell count. Dense right upper lobe infiltrate.  On my review of the echocardiogram there is really no evidence of diastolic dysfunction. Note that both the intraventricular septum and the left ventricular posterior wall are normal in thickness and there is no evidence of left atrial dilatation. The diastolic annular tissue Doppler velocities are normal. The "abnormal relaxation" pattern is commonly seen in patients with hypovolemia despite normal myocardial  function.  PLAN:  Suspect her troponin elevation is called by the same hemodynamic decompensation that caused her oliguric renal failure and her abnormal liver function tests and elevation in CK, hemoconcentration, all suggestive of severe or protracted hypotension. Very low suspicion for acute coronary syndrome. She is in no way a candidate for invasive coronary evaluation in the setting of acute renal failure and active pulmonary infection. As mentioned above, I doubt the diagnosis of diastolic dysfunction. Recommend outpatient evaluation with a plain treadmill stress test after recovery from the acute illness. The absence of an elevated white blood cell count with such a dense infiltrate on chest x-ray, concomitant with renal failure does raise the intriguing possibility of noninfectious pneumonia such as Wegener's granulomatosis or even Goodpasture syndrome. She did have fever on admission, which would be more consistent with infectious etiology. Defer workup to Dr. Lowell Guitar.  In summary, I don't think there is any need for additional cardiac workup during this admission. Please reconsult [place] for any new developments and let us know to schedule follow-up.Thurmon Fair, MD, [facility] [phone] 05/03/2017, 6:50 PM

## 2017-05-03 NOTE — Progress Notes (Signed)
Nutrition Brief Note  Patient identified on the Malnutrition Screening Tool (MST) Report  Wt Readings from Last 15 Encounters:  05/02/17 160 lb (72.6 kg)  05/01/17 150 lb (68 kg)  01/27/16 166 lb 6.4 oz (75.5 kg)  09/19/15 169 lb 11.2 oz (77 kg)  09/16/15 175 lb (79.4 kg)  09/01/15 175 lb (79.4 kg)  04/04/15 180 lb 9.6 oz (81.9 kg)  02/03/15 180 lb 9.6 oz (81.9 kg)  12/30/14 183 lb (83 kg)  07/01/14 191 lb (86.6 kg)  02/24/14 197 lb (89.4 kg)  02/21/14 203 lb (92.1 kg)  01/27/14 202 lb (91.6 kg)  01/20/14 203 lb (92.1 kg)  08/03/13 197 lb 3.2 oz (89.4 kg)    Veronica Turner  is a 56 y.o. female, w hypertension, c/o cough for the past 1 week, and dyspnea.  Cough productive yellow sputum.  + fever for the past 1 day. Slight diarrhea, "loose stool" for the past 2 days.   Pt denies cp, palp, n/v, abd pain,  brbpr. Pt had been to urgent care on 9/7 and treated with tessalon pearls.    Pt admitted with AKI. Pt sleeping soundly at time of visit and did not arouse when name was called.   Wt has been stable for > 1 year.  Nutrition-Focused physical exam completed. Findings are no fat depletion, no muscle depletion, and no edema.   Body mass index is 28.34 kg/m. Patient meets criteria for overweight based on current BMI.   Current diet order is renal, patient is consuming approximately n/a% of meals at this time. Labs and medications reviewed.   No nutrition interventions warranted at this time. If nutrition issues arise, please consult RD.   Shaliah Wann A. Mayford Knife, RD, LDN, CDE Pager: [phone] After hours Pager: [phone]

## 2017-05-03 NOTE — Progress Notes (Signed)
[facility]                                                                              Patient Demographics  Veronica Turner, is a 56 y.o. female, DOB - [birth year], ZOX:[id number]  Admit date - 05/02/2017   Admitting Physician Pearson Grippe, MD  Outpatient Primary MD for the patient is Patient, No Pcp Per  Outpatient specialists:   LOS - 1  days   Medical records reviewed and are as summarized below:    Chief Complaint  Patient presents with  . Fatigue  . Altered Mental Status  . Cough       Brief summary   Per Dr. Elmyra Ricks admit note on 9/13  Veronica Turner  is a 56 y.o. female, w hypertension, c/o cough for the past 1 week, and dyspnea.  Cough productive yellow sputum.  + fever for the past 1 day. Slight diarrhea, "loose stool" for the past 2 days.   Pt denies cp, palp, n/v, abd pain,  brbpr. Pt had been to urgent care on 9/7 and treated with tessalon pearls.   In ED, CXR showed extensive RUL pneumonia.  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, slight fullness of each renal collecting system without any obstructing focus - creatinine started trending up to 6.8, CK elevated at 1636 at the time of admission - continue IV fluid hydration, strict I's and O's, continue bicarbonate  Active Problems: Elevated troponins, prolonged QTC - Currently no chest pain or shortness of breath - Troponins peaked at 0.23, EKG showed normal sinus rhythm, LVH, prolonged QT 530 however EKG on 9/12 had shown  QTC of 418 - EKG in am to compare QTc  - obtain 2-D echo, cardiology consulted    Hypokalemia - K 3.4, replaced orally    Community acquired pneumonia, Legionella pneumonia - urine strep antigen negative, urine legionella antigen positive - DC Levaquin due to prolonged QTC, placed on Zithromax for 10 days (d/w Dr Ilsa Iha ID) with renal dosing     Hyponatremia - improving, continue IV fluid hydration    Transaminitis With hyperbilirubinemia - Unclear etiology, right upper quadrant ultrasound showed normal appearance of gallbladder and biliary tree, no gallstones or wall thickening no Murphy sign. CBD normal 5 mm. Equally vocal increased hepatic density can be seen with mild steatosis, no focal hepatic lesion - LFTs improving, hepatitis panel negative  Hypertension - Currently BP borderline, continue low-dose Coreg - continue to hold HCTZ, Imdur, losartan, amlodipine.   Generalized deconditioning - Start physical therapy  Diarrhea - likely from Legionella, C. Difficile pending   Code Status:Full CODE STATUS  DVT Prophylaxis: Heparin subcutaneous  Family Communication: Discussed in detail with the patient, all imaging results,  lab results explained to the patient  Disposition Plan:   Time Spent in minutes   25 minutes  Procedures:  Right upper quadrant ultrasound renal ultrasound  Consultants:   Nephrology   Antimicrobials:   levaquin 9/13 -> 9/14  Azithro 9/14 ->     Medications  Scheduled Meds: . aspirin  81 mg Oral Daily  . carvedilol  3.125 mg Oral BID WC  . heparin  5,000 Units Subcutaneous Q8H  . sodium bicarbonate  1,300 mg Oral BID   Continuous Infusions: . sodium chloride 150 mL/hr at 05/03/17 1216  . [START ON 05/04/2017] levofloxacin (LEVAQUIN) IV     PRN Meds:.HYDROcodone-homatropine   Antibiotics   Anti-infectives    Start     Dose/Rate Route Frequency Ordered Stop   05/04/17 0400  levofloxacin (LEVAQUIN) IVPB 500 mg     500 mg 100 mL/hr  over 60 Minutes Intravenous Every 48 hours 05/02/17 0509     05/02/17 0130  levofloxacin (LEVAQUIN) IVPB 750 mg     750 mg 100 mL/hr over 90 Minutes Intravenous  Once 05/02/17 0125 05/02/17 0355   05/02/17 0130  vancomycin (VANCOCIN) IVPB 1000 mg/200 mL premix     1,000 mg 200 mL/hr over 60 Minutes Intravenous  Once 05/02/17 0125 05/02/17 0325        Subjective:   Veronica Turner was seen and examined today. Having diarrhea but no fevers or chills.  Patient denies dizziness, chest pain, shortness of breath, D/C, new weakness, numbess, tingling. No fevers  Objective:   Vitals:   05/02/17 1500 05/02/17 1632 05/03/17 0424 05/03/17 0912  BP: 121/75 131/80 103/82 (!) 142/82  Pulse: 79 79 80 84  Resp: (!) 28 (!) 22 20   Temp:  98.5 F (36.9 C) 98.4 F (36.9 C)   TempSrc:  Oral Oral   SpO2: 95% 96% 97%   Weight:  72.6 kg (160 lb)    Height:   (1.6 m)      Intake/Output Summary (Last 24 hours) at 05/03/17 1236 Last data filed at 05/03/17 0440  Gross per 24 hour  Intake                0 ml  Output                0 ml  Net                0 ml     Wt Readings from Last 3 Encounters:  05/02/17 72.6 kg (160 lb)  05/01/17 68 kg (150 lb)  01/27/16 75.5 kg (166 lb 6.4 oz)     Exam  General: Alert and oriented x 3, NAD Eyes:  HEENT:  Atraumatic, normocephalic Cardiovascular: S1 S2 auscultated, no rubs, murmurs or gallops. Regular rate and rhythm. No pedal edema b/l Respiratory: right-sided crackles,no wheezing Gastrointestinal: Soft, nontender, nondistended, + bowel sounds Ext: no pedal edema bilaterally Neuro:no new deficits Musculoskeletal: No digital cyanosis, clubbing Skin: No rashes Psych: Normal affect and demeanor, alert and oriented x3      Data Reviewed:  I have personally reviewed following labs and imaging studies  Micro Results Recent Results (from the past 240 hour(s))  Urine culture     Status: None   Collection Time: 05/02/17  1:25 AM  Result  Value Ref Range Status   Specimen Description URINE, CATHETERIZED  Final   Special Requests NONE  Final   Culture NO GROWTH  Final   Report Status 05/03/2017 FINAL  Final  Culture, blood (Routine x 2)     Status: None (Preliminary result)   Collection Time: 05/02/17  2:00 AM  Result Value Ref Range Status   Specimen Description BLOOD LEFT ANTECUBITAL  Final   Special Requests   Final    BOTTLES DRAWN AEROBIC AND ANAEROBIC Blood Culture adequate volume   Culture NO GROWTH 1 DAY  Final   Report Status PENDING  Incomplete  Culture, blood (Routine x 2)     Status: None (Preliminary result)   Collection Time: 05/02/17  3:54 AM  Result Value Ref Range Status   Specimen Description BLOOD RIGHT WRIST  Final   Special Requests   Final    BOTTLES DRAWN AEROBIC AND ANAEROBIC Blood Culture adequate volume   Culture NO GROWTH 1 DAY  Final   Report Status PENDING  Incomplete    Radiology Reports Dg Chest 2 View  Result Date: 05/02/2017 CLINICAL DATA:  Cough and weakness for 2 days. EXAM: CHEST  2 VIEW COMPARISON:  PA and lateral chest 12/15/2015. FINDINGS: There is extensive right upper lobe airspace disease. The left lung is clear. Heart size is normal. No pneumothorax or pleural fluid. IMPRESSION: Extensive right upper lobe airspace disease consistent with pneumonia. Recommend followup to clearing. Electronically Signed   By: Drusilla Kanner M.D.   On: 05/02/2017 01:15   US Renal  Result Date: 05/02/2017 CLINICAL DATA:  Acute renal insufficiency EXAM: RENAL / URINARY TRACT ULTRASOUND COMPLETE COMPARISON:  None. FINDINGS: Right Kidney: Length: 14.1 cm. Echogenicity is increased. Renal cortical thickness is within normal limits. No mass or perinephric fluid visualized. There is slight fullness of the right renal collecting system. No sonographically demonstrable calculus or ureterectasis on either side. Left Kidney: Length: 14.0 cm. Echogenicity is increased. Renal cortical thickness is within  normal limits. No mass or perinephric fluid visualized. There is slight fullness of the left renal collecting system. No sonographically demonstrable calculus or ureterectasis. Bladder: Appears normal for degree of bladder distention. Urinary bladder is nearly empty. IMPRESSION: Increased renal echogenicity, a finding that may be indicative of a degree of medical renal disease. Renal cortical thickness is within normal limits. There is slight fullness of each renal collecting system without obstructing focus seen on either side. The etiology for this mild bilateral collecting system fullness is uncertain. Electronically Signed   By: Bretta Bang III M.D.   On: 05/02/2017 08:00   US Abdomen Limited Ruq  Result Date: 05/02/2017 CLINICAL DATA:  Elevated LFTs. EXAM: ULTRASOUND ABDOMEN LIMITED RIGHT UPPER QUADRANT COMPARISON:  None. FINDINGS: Gallbladder: Physiologically distended. No gallstones or wall thickening visualized. No sonographic Murphy sign noted by sonographer. Common bile duct: Diameter: 5 mm, normal. Liver: No focal lesion identified. Equivocal increase in parenchymal echogenicity. Portal vein is patent on color Doppler imaging with normal direction of blood flow towards the liver. IMPRESSION: 1. Normal sonographic appearance of gallbladder and biliary tree. 2. Equivocal increased hepatic density can be seen with mild steatosis. No focal hepatic lesion. Electronically Signed   By: Rubye Oaks M.D.   On: 05/02/2017 05:42    Lab Data:  CBC:  Recent Labs Lab 04/26/17 1553 05/01/17 1627 05/02/17 0200 05/02/17 1113 05/03/17 0720  WBC  --  8.1 7.5 6.2 7.9  NEUTROABS  --   --  6.5  --   --   HGB 17.0* 14.6 14.6 13.0 12.6  HCT 50.0* 42.0 41.9 37.9 36.5  MCV  --  89.2 89.5 90.0 88.8  PLT  --  181 180  156 186   Basic Metabolic Panel:  Recent Labs Lab 04/26/17 1553 05/01/17 1627 05/02/17 0200 05/02/17 1113 05/03/17 0720  NA 137 131* 129*  --  130*  K 3.8 3.3* 3.4*  --   3.4*  CL 99* 91* 91*  --  100*  CO2  --  22 20*  --  15*  GLUCOSE 105* 114* 107*  --  85  BUN 22* 60* 66*  --  72*  CREATININE 1.10* 5.42* 6.54* 6.32* 6.82*  CALCIUM  --  9.1 8.9  --  8.8*   GFR: Estimated Creatinine Clearance: 8.9 mL/min (A) (by C-G formula based on SCr of 6.82 mg/dL (H)). Liver Function Tests:  Recent Labs Lab 05/01/17 1627 05/02/17 0200 05/03/17 0720  AST 220* 225* 145*  ALT 95* 96* 80*  ALKPHOS 95 87 104  BILITOT 4.0* 4.6* 2.2*  PROT 7.4 7.5 6.5  ALBUMIN 3.1* 2.8* 2.1*    Recent Labs Lab 05/01/17 1627  LIPASE 35   No results for input(s): AMMONIA in the last 168 hours. Coagulation Profile:  Recent Labs Lab 05/02/17 0200  INR 1.05   Cardiac Enzymes:  Recent Labs Lab 05/02/17 1113 05/02/17 1318 05/02/17 1635 05/02/17 2024 05/03/17 0720  CKTOTAL 1,636* 1,621*  --   --  807*  CKMB 11.8*  --   --   --   --   TROPONINI 0.08*  --  0.08* 0.23* 0.05*   BNP (last 3 results) No results for input(s): PROBNP in the last 8760 hours. HbA1C: No results for input(s): HGBA1C in the last 72 hours. CBG: No results for input(s): GLUCAP in the last 168 hours. Lipid Profile: No results for input(s): CHOL, HDL, LDLCALC, TRIG, CHOLHDL, LDLDIRECT in the last 72 hours. Thyroid Function Tests:  Recent Labs  05/02/17 1318  TSH 0.625   Anemia Panel: No results for input(s): VITAMINB12, FOLATE, FERRITIN, TIBC, IRON, RETICCTPCT in the last 72 hours. Urine analysis:    Component Value Date/Time   COLORURINE AMBER (A) 05/02/2017 0325   APPEARANCEUR CLOUDY (A) 05/02/2017 0325   LABSPEC 1.016 05/02/2017 0325   PHURINE 5.0 05/02/2017 0325   GLUCOSEU NEGATIVE 05/02/2017 0325   HGBUR LARGE (A) 05/02/2017 0325   BILIRUBINUR NEGATIVE 05/02/2017 0325   KETONESUR NEGATIVE 05/02/2017 0325   PROTEINUR 100 (A) 05/02/2017 0325   NITRITE NEGATIVE 05/02/2017 0325   LEUKOCYTESUR NEGATIVE 05/02/2017 0325     Ripudeep Rai M.D. [facility] 05/03/2017, 12:36  PM  Pager: [phone] Between 7am to 7pm - call Pager - [phone]  After 7pm go to [email or web address] - password [email or web address]  Call night coverage person covering after 7pm

## 2017-05-04 ENCOUNTER — Inpatient Hospital Stay (HOSPITAL_COMMUNITY): Payer: Self-pay

## 2017-05-04 DIAGNOSIS — R945 Abnormal results of liver function studies: Secondary | ICD-10-CM

## 2017-05-04 DIAGNOSIS — N179 Acute kidney failure, unspecified: Secondary | ICD-10-CM

## 2017-05-04 LAB — CBC
HCT: 35.5 % — ABNORMAL LOW (ref 36.0–46.0)
HEMOGLOBIN: 12.2 g/dL (ref 12.0–15.0)
MCH: 30.5 pg (ref 26.0–34.0)
MCHC: 34.4 g/dL (ref 30.0–36.0)
MCV: 88.8 fL (ref 78.0–100.0)
Platelets: 235 10*3/uL (ref 150–400)
RBC: 4 MIL/uL (ref 3.87–5.11)
RDW: 14.5 % (ref 11.5–15.5)
WBC: 8.1 10*3/uL (ref 4.0–10.5)

## 2017-05-04 LAB — COMPREHENSIVE METABOLIC PANEL
ALBUMIN: 1.9 g/dL — AB (ref 3.5–5.0)
ALT: 69 U/L — AB (ref 14–54)
AST: 109 U/L — AB (ref 15–41)
Alkaline Phosphatase: 108 U/L (ref 38–126)
Anion gap: 11 (ref 5–15)
BILIRUBIN TOTAL: 1.4 mg/dL — AB (ref 0.3–1.2)
BUN: 72 mg/dL — AB (ref 6–20)
CO2: 16 mmol/L — ABNORMAL LOW (ref 22–32)
CREATININE: 5.44 mg/dL — AB (ref 0.44–1.00)
Calcium: 8.4 mg/dL — ABNORMAL LOW (ref 8.9–10.3)
Chloride: 108 mmol/L (ref 101–111)
GFR calc Af Amer: 9 mL/min — ABNORMAL LOW (ref >60)
GFR, EST NON AFRICAN AMERICAN: 8 mL/min — AB (ref >60)
GLUCOSE: 105 mg/dL — AB (ref 65–99)
POTASSIUM: 3.2 mmol/L — AB (ref 3.5–5.1)
Sodium: 135 mmol/L (ref 135–145)
TOTAL PROTEIN: 5.7 g/dL — AB (ref 6.5–8.1)

## 2017-05-04 LAB — C DIFFICILE QUICK SCREEN W PCR REFLEX
C DIFFICLE (CDIFF) ANTIGEN: POSITIVE — AB
C Diff toxin: NEGATIVE

## 2017-05-04 LAB — CLOSTRIDIUM DIFFICILE BY PCR: CDIFFPCR: NEGATIVE

## 2017-05-04 MED ORDER — FUROSEMIDE 10 MG/ML IJ SOLN
40.0000 mg | Freq: Once | INTRAMUSCULAR | Status: AC
  Administered 2017-05-04: 40 mg via INTRAVENOUS
  Filled 2017-05-04: qty 4

## 2017-05-04 MED ORDER — IPRATROPIUM-ALBUTEROL 0.5-2.5 (3) MG/3ML IN SOLN: 3.0000 mL | RESPIRATORY_TRACT | Status: DC | PRN

## 2017-05-04 MED ORDER — POTASSIUM CHLORIDE CRYS ER 20 MEQ PO TBCR
40.0000 meq | EXTENDED_RELEASE_TABLET | Freq: Once | ORAL | Status: AC
  Administered 2017-05-04: 40 meq via ORAL
  Filled 2017-05-04: qty 2

## 2017-05-04 MED ORDER — CARVEDILOL 12.5 MG PO TABS
12.5000 mg | ORAL_TABLET | Freq: Two times a day (BID) | ORAL | Status: DC
  Administered 2017-05-04 – 2017-05-06 (×6): 12.5 mg via ORAL
  Filled 2017-05-04 (×6): qty 1

## 2017-05-04 MED ORDER — IPRATROPIUM-ALBUTEROL 0.5-2.5 (3) MG/3ML IN SOLN
3.0000 mL | RESPIRATORY_TRACT | Status: DC
  Administered 2017-05-04: 3 mL via RESPIRATORY_TRACT
  Filled 2017-05-04: qty 3

## 2017-05-04 MED ORDER — IPRATROPIUM-ALBUTEROL 0.5-2.5 (3) MG/3ML IN SOLN
3.0000 mL | Freq: Three times a day (TID) | RESPIRATORY_TRACT | Status: DC
  Administered 2017-05-04 – 2017-05-06 (×6): 3 mL via RESPIRATORY_TRACT
  Filled 2017-05-04 (×7): qty 3

## 2017-05-04 NOTE — Progress Notes (Signed)
[facility]                                                                              Patient Demographics  Veronica Turner, is a 56 y.o. female, DOB - 1961-02-28, ZOX:[id number]  Admit date - 05/02/2017   Admitting Physician Pearson Grippe, MD  Outpatient Primary MD for the patient is Patient, No Pcp Per  Outpatient specialists:   LOS - 2  days   Medical records reviewed and are as summarized below:    Chief Complaint  Patient presents with  . Fatigue  . Altered Mental Status  . Cough       Brief summary   Per Dr. Elmyra Ricks admit note on 9/13  Veronica Turner  is a 56 y.o. female, w hypertension, c/o cough for the past 1 week, and dyspnea.  Cough productive yellow sputum.  + fever for the past 1 day. Slight diarrhea, "loose stool" for the past 2 days.   Pt denies cp, palp, n/v, abd pain,  brbpr. Pt had been to urgent care on 9/7 and treated with tessalon pearls.   In ED, CXR showed extensive RUL pneumonia.  Pt wbc 7.5, Bun/creatinine 66/6.54, Glucose 107,  Pt will be admitted for ARF, and pneumonia, and hypokalemia   Assessment & Plan    Principal Problem:   ARF (acute renal failure) (HCC), rhabdomyolysis - Multifactorial, patient had diarrhea in the last 2 days causing dehydration, pneumonia, medications including HCTZ, losartan, has been taking NSAIDs Mobic 7.5 mg twice a day, rhabdomyolysis. Creatinine 5.42 on admission - Hold HCTZ, losartan, NSAIDs - Renal ultrasound showed increased renal echogenicity indicating above medical renal disease, slight fullness of each renal collecting system without any obstructing focus - creatinine peaked at 6.8, trending down to 5.4 today. - will hold IV fluids, at the time of my examination wheezing and short of breath. Discussed with nephrology, gave Lasix 40 mg IV 1 with significant relief.  - chest x-ray stat showed worsened right-sided radiation, infection or aspiration  Active Problems: Elevated troponins,  prolonged QTC - Troponins peaked at 0.23, EKG showed normal sinus rhythm, LVH, prolonged QT 530 however EKG on 9/12 had shown QTC of 418.  - Repeated EKG, showed QTC improved to 446 today - 2-D echo showed EF of 50-55% with grade 1 diastolic dysfunction, no regional wall motion abnormalities - cardiology was consulted and recommended outpatient evaluation with treadmill stress test after recovered from acute illness     Hypokalemia - potassium 3.2, replaced    Community acquired pneumonia, Legionella pneumonia - urine strep antigen negative, urine legionella antigen positive - discontinued vancomycin and Levaquin. Levaquin was discontinued due to prolonged QTC. - continue Zithromax for 10 days (d/w Dr Ilsa Iha ID) with renal dosing  - HIV negative    Hyponatremia -improved, hold IV fluids    Transaminitis With hyperbilirubinemia - Unclear etiology, right upper quadrant ultrasound showed normal appearance of gallbladder and biliary tree, no gallstones or wall thickening no Murphy sign. CBD normal 5 mm. Equally vocal increased hepatic density can be seen with mild steatosis, no focal hepatic lesion - LFTs continued to trend down, hepatitis panel negative  Hypertension -BP improved, increase Coreg to outpatient dosing, 12.5 mg twice a day - Will add Imdur if BP continues to trend up - continue to hold HCTZ, Imdur, losartan, amlodipine.   Generalized deconditioning - Start physical therapy  Diarrhea - likely from Legionella, C. Difficile pending   Code Status:Full CODE STATUS  DVT Prophylaxis: Heparin subcutaneous  Family Communication: Discussed in detail with the patient, all imaging results, lab results explained to the patient  Disposition Plan:   Time Spent in minutes   25 minutes  Procedures:  Right upper quadrant ultrasound renal ultrasound  Consultants:   Nephrology   Antimicrobials:   levaquin 9/13 -> 9/14  Azithro 9/14 ->     Medications  Scheduled  Meds: . aspirin  81 mg Oral Daily  . [START ON 05/05/2017] azithromycin  250 mg Oral Daily  . carvedilol  12.5 mg Oral BID WC  . heparin  5,000 Units Subcutaneous Q8H  . ipratropium-albuterol  3 mL Nebulization TID  . sodium bicarbonate  1,300 mg Oral BID   Continuous Infusions:  PRN Meds:.HYDROcodone-homatropine, ipratropium-albuterol   Antibiotics   Anti-infectives    Start     Dose/Rate Route Frequency Ordered Stop   05/05/17 1000  azithromycin (ZITHROMAX) tablet 250 mg     250 mg Oral Daily 05/03/17 1252     05/04/17 0500  azithromycin (ZITHROMAX) tablet 500 mg     500 mg Oral  Once 05/03/17 1250 05/04/17 0604   05/04/17 0400  levofloxacin (LEVAQUIN) IVPB 500 mg  Status:  Discontinued     500 mg 100 mL/hr over 60 Minutes Intravenous Every 48 hours 05/02/17 0509 05/03/17 1250   05/02/17 0130  levofloxacin (LEVAQUIN) IVPB 750 mg     750 mg 100 mL/hr over 90 Minutes Intravenous  Once 05/02/17 0125 05/02/17 0355   05/02/17 0130  vancomycin (VANCOCIN) IVPB 1000 mg/200 mL premix     1,000 mg 200 mL/hr over 60 Minutes Intravenous  Once 05/02/17 0125 05/02/17 0325        Subjective:   Veronica Turner was seen and examined today. At the time of examination, was feeling short of breath with wheezing diffusely. No fevers or chest pain.  Patient denies dizziness, chest pain, shortness of breath, D/C, new weakness, numbess, tingling.   Objective:   Vitals:   05/03/17 2305 05/04/17 0607 05/04/17 0801 05/04/17 0814  BP: (!) 157/75 (!) 151/83 127/77   Pulse: 87 81 79   Resp: 18 20    Temp: 99.1 F (37.3 C) 98.5 F (36.9 C)    TempSrc: Oral Oral    SpO2: 95% 94%  94%  Weight:      Height:        Intake/Output Summary (Last 24 hours) at 05/04/17 1335 Last data filed at 05/04/17 1000  Gross per 24 hour  Intake             3445 ml  Output                0 ml  Net             3445 ml     Wt Readings from Last 3 Encounters:  05/02/17 72.6 kg (160 lb)  05/01/17 68 kg  (150 lb)  01/27/16 75.5 kg (166 lb 6.4 oz)     Exam   General: Alert and oriented x 3, NAD  Eyes:   HEENT:    Cardiovascular: S1 S2 auscultated, no rubs, murmurs or gallops. Regular rate and  rhythm. No pedal edema b/l  Respiratory: diffuse wheezing bilaterally with bibasilar crackles  Gastrointestinal: Soft, nontender, nondistended, + bowel sounds  Ext: no pedal edema bilaterally  Neuro: no new neuro deficits  Musculoskeletal: No digital cyanosis, clubbing  Skin: No rashes  Psych: Normal affect and demeanor, alert and oriented x3    Data Reviewed:  I have personally reviewed following labs and imaging studies  Micro Results Recent Results (from the past 240 hour(s))  Urine culture     Status: None   Collection Time: 05/02/17  1:25 AM  Result Value Ref Range Status   Specimen Description URINE, CATHETERIZED  Final   Special Requests NONE  Final   Culture NO GROWTH  Final   Report Status 05/03/2017 FINAL  Final  Culture, blood (Routine x 2)     Status: None (Preliminary result)   Collection Time: 05/02/17  2:00 AM  Result Value Ref Range Status   Specimen Description BLOOD LEFT ANTECUBITAL  Final   Special Requests   Final    BOTTLES DRAWN AEROBIC AND ANAEROBIC Blood Culture adequate volume   Culture NO GROWTH 2 DAYS  Final   Report Status PENDING  Incomplete  Culture, blood (Routine x 2)     Status: None (Preliminary result)   Collection Time: 05/02/17  3:54 AM  Result Value Ref Range Status   Specimen Description BLOOD RIGHT WRIST  Final   Special Requests   Final    BOTTLES DRAWN AEROBIC AND ANAEROBIC Blood Culture adequate volume   Culture NO GROWTH 2 DAYS  Final   Report Status PENDING  Incomplete  MRSA PCR Screening     Status: None   Collection Time: 05/03/17 12:16 PM  Result Value Ref Range Status   MRSA by PCR NEGATIVE NEGATIVE Final    Comment:        The GeneXpert MRSA Assay (FDA approved for NASAL specimens only), is one component of  a comprehensive MRSA colonization surveillance program. It is not intended to diagnose MRSA infection nor to guide or monitor treatment for MRSA infections.     Radiology Reports Dg Chest 2 View  Result Date: 05/02/2017 CLINICAL DATA:  Cough and weakness for 2 days. EXAM: CHEST  2 VIEW COMPARISON:  PA and lateral chest 12/15/2015. FINDINGS: There is extensive right upper lobe airspace disease. The left lung is clear. Heart size is normal. No pneumothorax or pleural fluid. IMPRESSION: Extensive right upper lobe airspace disease consistent with pneumonia. Recommend followup to clearing. Electronically Signed   By: Drusilla Kanner M.D.   On: 05/02/2017 01:15   US Renal  Result Date: 05/02/2017 CLINICAL DATA:  Acute renal insufficiency EXAM: RENAL / URINARY TRACT ULTRASOUND COMPLETE COMPARISON:  None. FINDINGS: Right Kidney: Length: 14.1 cm. Echogenicity is increased. Renal cortical thickness is within normal limits. No mass or perinephric fluid visualized. There is slight fullness of the right renal collecting system. No sonographically demonstrable calculus or ureterectasis on either side. Left Kidney: Length: 14.0 cm. Echogenicity is increased. Renal cortical thickness is within normal limits. No mass or perinephric fluid visualized. There is slight fullness of the left renal collecting system. No sonographically demonstrable calculus or ureterectasis. Bladder: Appears normal for degree of bladder distention. Urinary bladder is nearly empty. IMPRESSION: Increased renal echogenicity, a finding that may be indicative of a degree of medical renal disease. Renal cortical thickness is within normal limits. There is slight fullness of each renal collecting system without obstructing focus seen on either side. The etiology for this  mild bilateral collecting system fullness is uncertain. Electronically Signed   By: Bretta Bang III M.D.   On: 05/02/2017 08:00   Dg Chest Port 1v Same Day  Result  Date: 05/04/2017 CLINICAL DATA:  Dyspnea. EXAM: PORTABLE CHEST 1 VIEW COMPARISON:  05/02/2017 FINDINGS: Midline trachea. Normal heart size. No pleural effusion or pneumothorax. Slight worsening right upper lobe airspace disease. There may be mild right infrahilar patchy airspace disease as well. Minimal left base volume loss. IMPRESSION: Worsened right-sided aeration, most consistent with infection or aspiration. Recommend radiographic follow-up until clearing. Electronically Signed   By: Jeronimo Greaves M.D.   On: 05/04/2017 08:23   US Abdomen Limited Ruq  Result Date: 05/02/2017 CLINICAL DATA:  Elevated LFTs. EXAM: ULTRASOUND ABDOMEN LIMITED RIGHT UPPER QUADRANT COMPARISON:  None. FINDINGS: Gallbladder: Physiologically distended. No gallstones or wall thickening visualized. No sonographic Murphy sign noted by sonographer. Common bile duct: Diameter: 5 mm, normal. Liver: No focal lesion identified. Equivocal increase in parenchymal echogenicity. Portal vein is patent on color Doppler imaging with normal direction of blood flow towards the liver. IMPRESSION: 1. Normal sonographic appearance of gallbladder and biliary tree. 2. Equivocal increased hepatic density can be seen with mild steatosis. No focal hepatic lesion. Electronically Signed   By: Rubye Oaks M.D.   On: 05/02/2017 05:42    Lab Data:  CBC:  Recent Labs Lab 05/01/17 1627 05/02/17 0200 05/02/17 1113 05/03/17 0720 05/04/17 0519  WBC 8.1 7.5 6.2 7.9 8.1  NEUTROABS  --  6.5  --   --   --   HGB 14.6 14.6 13.0 12.6 12.2  HCT 42.0 41.9 37.9 36.5 35.5*  MCV 89.2 89.5 90.0 88.8 88.8  PLT 181 180 156 186 235   Basic Metabolic Panel:  Recent Labs Lab 05/01/17 1627 05/02/17 0200 05/02/17 1113 05/03/17 0720 05/04/17 0519  NA 131* 129*  --  130* 135  K 3.3* 3.4*  --  3.4* 3.2*  CL 91* 91*  --  100* 108  CO2 22 20*  --  15* 16*  GLUCOSE 114* 107*  --  85 105*  BUN 60* 66*  --  72* 72*  CREATININE 5.42* 6.54* 6.32* 6.82* 5.44*   CALCIUM 9.1 8.9  --  8.8* 8.4*   GFR: Estimated Creatinine Clearance: 11.2 mL/min (A) (by C-G formula based on SCr of 5.44 mg/dL (H)). Liver Function Tests:  Recent Labs Lab 05/01/17 1627 05/02/17 0200 05/03/17 0720 05/04/17 0519  AST 220* 225* 145* 109*  ALT 95* 96* 80* 69*  ALKPHOS 95 87 104 108  BILITOT 4.0* 4.6* 2.2* 1.4*  PROT 7.4 7.5 6.5 5.7*  ALBUMIN 3.1* 2.8* 2.1* 1.9*    Recent Labs Lab 05/01/17 1627  LIPASE 35   No results for input(s): AMMONIA in the last 168 hours. Coagulation Profile:  Recent Labs Lab 05/02/17 0200  INR 1.05   Cardiac Enzymes:  Recent Labs Lab 05/02/17 1113 05/02/17 1318 05/02/17 1635 05/02/17 2024 05/03/17 0720  CKTOTAL 1,636* 1,621*  --   --  807*  CKMB 11.8*  --   --   --   --   TROPONINI 0.08*  --  0.08* 0.23* 0.05*   BNP (last 3 results) No results for input(s): PROBNP in the last 8760 hours. HbA1C: No results for input(s): HGBA1C in the last 72 hours. CBG: No results for input(s): GLUCAP in the last 168 hours. Lipid Profile: No results for input(s): CHOL, HDL, LDLCALC, TRIG, CHOLHDL, LDLDIRECT in the last 72 hours. Thyroid Function  Tests:  Recent Labs  05/02/17 1318  TSH 0.625   Anemia Panel: No results for input(s): VITAMINB12, FOLATE, FERRITIN, TIBC, IRON, RETICCTPCT in the last 72 hours. Urine analysis:    Component Value Date/Time   COLORURINE AMBER (A) 05/02/2017 0325   APPEARANCEUR CLOUDY (A) 05/02/2017 0325   LABSPEC 1.016 05/02/2017 0325   PHURINE 5.0 05/02/2017 0325   GLUCOSEU NEGATIVE 05/02/2017 0325   HGBUR LARGE (A) 05/02/2017 0325   BILIRUBINUR NEGATIVE 05/02/2017 0325   KETONESUR NEGATIVE 05/02/2017 0325   PROTEINUR 100 (A) 05/02/2017 0325   NITRITE NEGATIVE 05/02/2017 0325   LEUKOCYTESUR NEGATIVE 05/02/2017 0325     Shirl Ludington M.D. [facility] 05/04/2017, 1:35 PM  Pager: [phone] Between 7am to 7pm - call Pager - [phone]  After 7pm go to [email or web address] - password  [email or web address]  Call night coverage person covering after 7pm

## 2017-05-04 NOTE — Progress Notes (Signed)
[facility] Progress Note    Assessment/ Plan:   1. Legionella pneumonia: s/p IV levaquin, vanc, and now on zithromax.    2.  Diarrhea: supportive, C diff pending  3.  Acute kidney injury: multifactorial- was on HCTZ, ARB, and COX-2 inhibitor PTA. Renal US without obstruction.  Getting better, agree with holding fluids now with wheezing.  4.  Hypertension: holding BP meds  5.  Metabolic acidosis: likely from AKI.  Sodium Bicarb  6  Hypokalemia: supplementing  7.  Elevated LFTs: RUQ U/S without cholecystitis  8.  Hyponatremia: likely hypovolemic (esp with HCTZ PTA).  Expect to improve with vol expansion/ cessation of HCTZ/ treatment of PNA.   9.  Wheezing: s/p IV lasix and nebs.    Subjective:    Had episode of wheezing this AM which resolved with stopping fluids, giving IV lasix, and giving a neb.  When I saw her pt had showered and was resting comfortably in bed.     Objective:   BP 127/77   Pulse 79   Temp 98.5 F (36.9 C) (Oral)   Resp 20   Ht  (1.6 m)   Wt 72.6 kg (160 lb)   LMP 07/01/2014   SpO2 94%   BMI 28.34 kg/m   Intake/Output Summary (Last 24 hours) at 05/04/17 1047 Last data filed at 05/04/17 0653  Gross per 24 hour  Intake             3085 ml  Output                0 ml  Net             3085 ml   Weight change:   Physical Exam:  GEN NAD, appears a little tired HEENT EOMI, PERRL NECK no JVD PULM R sided anterior crackles, posterior as well.  L lung clear.  No wheezing on my exam CV RRR no m/r/g ABD benign EXT trace LE edema NEURO AAO x 3   Imaging: Dg Chest Port 1v Same Day  Result Date: 05/04/2017 CLINICAL DATA:  Dyspnea. EXAM: PORTABLE CHEST 1 VIEW COMPARISON:  05/02/2017 FINDINGS: Midline trachea. Normal heart size. No pleural effusion or pneumothorax. Slight worsening right upper lobe airspace disease. There may be mild right infrahilar patchy airspace disease as well. Minimal left base volume loss. IMPRESSION:  Worsened right-sided aeration, most consistent with infection or aspiration. Recommend radiographic follow-up until clearing. Electronically Signed   By: Jeronimo Greaves M.D.   On: 05/04/2017 08:23    Labs: BMET  Recent Labs Lab 05/01/17 1627 05/02/17 0200 05/02/17 1113 05/03/17 0720 05/04/17 0519  NA 131* 129*  --  130* 135  K 3.3* 3.4*  --  3.4* 3.2*  CL 91* 91*  --  100* 108  CO2 22 20*  --  15* 16*  GLUCOSE 114* 107*  --  85 105*  BUN 60* 66*  --  72* 72*  CREATININE 5.42* 6.54* 6.32* 6.82* 5.44*  CALCIUM 9.1 8.9  --  8.8* 8.4*   CBC  Recent Labs Lab 05/02/17 0200 05/02/17 1113 05/03/17 0720 05/04/17 0519  WBC 7.5 6.2 7.9 8.1  NEUTROABS 6.5  --   --   --   HGB 14.6 13.0 12.6 12.2  HCT 41.9 37.9 36.5 35.5*  MCV 89.5 90.0 88.8 88.8  PLT 180 156 186 235    Medications:    . aspirin  81 mg Oral Daily  . [START ON 05/05/2017] azithromycin  250  mg Oral Daily  . carvedilol  12.5 mg Oral BID WC  . heparin  5,000 Units Subcutaneous Q8H  . ipratropium-albuterol  3 mL Nebulization TID  . sodium bicarbonate  1,300 mg Oral BID      Bufford Buttner, MD [facility] pgr [phone] 05/04/2017, 10:47 AM

## 2017-05-04 NOTE — Progress Notes (Signed)
Pt still refusing to wear telemetry.

## 2017-05-04 NOTE — Plan of Care (Signed)
Problem: Education: Goal: Knowledge of [facility] Education information/materials will improve Outcome: Progressing POC reviewed with pt.   

## 2017-05-04 NOTE — Progress Notes (Addendum)
Pt told this AM that we need a urine and stool sample. Hats were placed in the toilet to catch samples. Pt asked to let RN or NT know when she went to the bathroom. Pt did not let anyone know and flushed the samples. Pt said that she did not urinate and have a BM at the same time and that it was not enough of a BM to use. Pt educated that any amount will be enough to get a sample and that she does not have to get both samples at the same time. New hats are in the toilet and pt will let the RN know when she has a sample.

## 2017-05-04 NOTE — Evaluation (Signed)
Physical Therapy Evaluation Patient Details Name: Veronica Turner MRN: [id number] DOB: 05-15-1961 Today's Date: 05/04/2017   History of Present Illness  Pt is a 56 y/o female admitted secondary to fatigue and cough x1 week. Pt found to have acute renal failure and CAP. PMH including but not limited to HTN and TIA.  Clinical Impression  Pt presented supine in bed with HOB elevated, awake and willing to participate in therapy session. Prior to admission, pt reported that she was independent with all functional mobility and ADLs. Since feeling sick (for the past week) pt reported that she did not feel that she could safely go up and down the stairs at home secondary to weakness. Pt ambulated within her room with supervision for safety and occasionally reaching for support surfaces. Pt limited this session secondary to fatigue. PT will continue to follow acutely for mobility progression and to ensure a safe d/c home.    Follow Up Recommendations No PT follow up    Equipment Recommendations  None recommended by PT    Recommendations for Other Services       Precautions / Restrictions Precautions Precautions: None Restrictions Weight Bearing Restrictions: No      Mobility  Bed Mobility Overal bed mobility: Modified Independent                Transfers Overall transfer level: Modified independent Equipment used: None                Ambulation/Gait Ambulation/Gait assistance: Supervision Ambulation Distance (Feet): 40 Feet Assistive device: None (occasionally reaching for support surfaces within room) Gait Pattern/deviations: Step-through pattern;Decreased stride length Gait velocity: decreased Gait velocity interpretation: Below normal speed for age/gender General Gait Details: cautious, slowed gait within room, but no overt LOB or need for physical assistance  Stairs            Wheelchair Mobility    Modified Rankin (Stroke Patients Only)        Balance Overall balance assessment: Needs assistance Sitting-balance support: Feet supported Sitting balance-Leahy Scale: Good     Standing balance support: During functional activity;No upper extremity supported Standing balance-Leahy Scale: Fair                               Pertinent Vitals/Pain Pain Assessment: Faces Faces Pain Scale: Hurts a little bit Pain Location: generalized Pain Descriptors / Indicators: Sore Pain Intervention(s): Monitored during session;Repositioned    Home Living Family/patient expects to be discharged to:: Private residence Living Arrangements: Children Available Help at Discharge: Family;Available PRN/intermittently Type of Home: House Home Access: Stairs to enter Entrance Stairs-Rails: None Entrance Stairs-Number of Steps: 3 Home Layout: Two level Home Equipment: Cane - single point      Prior Function Level of Independence: Independent               Hand Dominance        Extremity/Trunk Assessment   Upper Extremity Assessment Upper Extremity Assessment: Overall WFL for tasks assessed    Lower Extremity Assessment Lower Extremity Assessment: Overall WFL for tasks assessed       Communication   Communication: No difficulties  Cognition Arousal/Alertness: Awake/alert Behavior During Therapy: WFL for tasks assessed/performed Overall Cognitive Status: Within Functional Limits for tasks assessed                                 General Comments:  cognition not formally assessed but [facility] for general conversation      General Comments      Exercises     Assessment/Plan    PT Assessment Patient needs continued PT services  PT Problem List Decreased strength;Decreased activity tolerance;Decreased mobility;Decreased balance;Decreased coordination;Decreased knowledge of use of DME;Decreased safety awareness;Decreased knowledge of precautions       PT Treatment Interventions DME  instruction;Functional mobility training;Gait training;Stair training;Therapeutic activities;Therapeutic exercise;Balance training;Neuromuscular re-education;Patient/family education    PT Goals (Current goals can be found in the Care Plan section)  Acute Rehab PT Goals Patient Stated Goal: feel better and go home PT Goal Formulation: With patient Time For Goal Achievement: 05/18/17 Potential to Achieve Goals: Good    Frequency Min 3X/week   Barriers to discharge        Co-evaluation               AM-PAC PT "6 Clicks" Daily Activity  Outcome Measure Difficulty turning over in bed (including adjusting bedclothes, sheets and blankets)?: None Difficulty moving from lying on back to sitting on the side of the bed? : None Difficulty sitting down on and standing up from a chair with arms (e.g., wheelchair, bedside commode, etc,.)?: None Help needed moving to and from a bed to chair (including a wheelchair)?: None Help needed walking in hospital room?: None Help needed climbing 3-5 steps with a railing? : A Little 6 Click Score: 23    End of Session   Activity Tolerance: Patient tolerated treatment well Patient left: in bed;with call bell/phone within reach Nurse Communication: Mobility status PT Visit Diagnosis: Other abnormalities of gait and mobility (R26.89)    Time: 4098-1191 PT Time Calculation (min) (ACUTE ONLY): 15 min   Charges:   PT Evaluation $PT Eval Moderate Complexity: 1 Mod     PT G Codes:        [place], PT, DPT [phone]   Alessandra Bevels Colin Norment 05/04/2017, 5:05 PM

## 2017-05-05 LAB — COMPREHENSIVE METABOLIC PANEL
ALK PHOS: 92 U/L (ref 38–126)
ALT: 57 U/L — AB (ref 14–54)
ANION GAP: 7 (ref 5–15)
AST: 60 U/L — ABNORMAL HIGH (ref 15–41)
Albumin: 1.9 g/dL — ABNORMAL LOW (ref 3.5–5.0)
BILIRUBIN TOTAL: 1.4 mg/dL — AB (ref 0.3–1.2)
BUN: 65 mg/dL — ABNORMAL HIGH (ref 6–20)
CALCIUM: 8.6 mg/dL — AB (ref 8.9–10.3)
CO2: 23 mmol/L (ref 22–32)
CREATININE: 3.6 mg/dL — AB (ref 0.44–1.00)
Chloride: 108 mmol/L (ref 101–111)
GFR, EST AFRICAN AMERICAN: 15 mL/min — AB (ref >60)
GFR, EST NON AFRICAN AMERICAN: 13 mL/min — AB (ref >60)
Glucose, Bld: 118 mg/dL — ABNORMAL HIGH (ref 65–99)
Potassium: 3 mmol/L — ABNORMAL LOW (ref 3.5–5.1)
SODIUM: 138 mmol/L (ref 135–145)
TOTAL PROTEIN: 5.6 g/dL — AB (ref 6.5–8.1)

## 2017-05-05 LAB — LACTOFERRIN, FECAL, QUALITATIVE: LACTOFERRIN, FECAL, QUAL: NEGATIVE

## 2017-05-05 LAB — CBC
HCT: 33.9 % — ABNORMAL LOW (ref 36.0–46.0)
HEMOGLOBIN: 11.6 g/dL — AB (ref 12.0–15.0)
MCH: 30.4 pg (ref 26.0–34.0)
MCHC: 34.2 g/dL (ref 30.0–36.0)
MCV: 88.7 fL (ref 78.0–100.0)
PLATELETS: 264 10*3/uL (ref 150–400)
RBC: 3.82 MIL/uL — AB (ref 3.87–5.11)
RDW: 14.4 % (ref 11.5–15.5)
WBC: 8.9 10*3/uL (ref 4.0–10.5)

## 2017-05-05 MED ORDER — PNEUMOCOCCAL VAC POLYVALENT 25 MCG/0.5ML IJ INJ
0.5000 mL | INJECTION | INTRAMUSCULAR | Status: DC
  Filled 2017-05-05: qty 0.5

## 2017-05-05 MED ORDER — POTASSIUM CHLORIDE CRYS ER 10 MEQ PO TBCR
30.0000 meq | EXTENDED_RELEASE_TABLET | Freq: Once | ORAL | Status: AC
  Administered 2017-05-05: 30 meq via ORAL
  Filled 2017-05-05: qty 1

## 2017-05-05 MED ORDER — AZITHROMYCIN 500 MG PO TABS
500.0000 mg | ORAL_TABLET | Freq: Every day | ORAL | Status: DC
  Administered 2017-05-05 – 2017-05-06 (×2): 500 mg via ORAL
  Filled 2017-05-05 (×2): qty 1

## 2017-05-05 NOTE — Progress Notes (Signed)
[facility]                                                                              Patient Demographics  Veronica Turner, is a 56 y.o. female, DOB - [birth year], ZOX:[id number]  Admit date - 05/02/2017   Admitting Physician Pearson Grippe, MD  Outpatient Primary MD for the patient is Patient, No Pcp Per  Outpatient specialists:   LOS - 3  days   Medical records reviewed and are as summarized below:    Chief Complaint  Patient presents with  . Fatigue  . Altered Mental Status  . Cough       Brief summary   Per Dr. Elmyra Ricks admit note on 9/13  Veronica Turner  is a 57 y.o. female, w hypertension, c/o cough for the past 1 week, and dyspnea.  Cough productive yellow sputum.  + fever for the past 1 day. Slight diarrhea, "loose stool" for the past 2 days.   Pt denies cp, palp, n/v, abd pain,  brbpr. Pt had been to urgent care on 9/7 and treated with tessalon pearls.   In ED, CXR showed extensive RUL pneumonia.  Pt wbc 7.5, Bun/creatinine 66/6.54, Glucose 107,  Pt will be admitted for ARF, and pneumonia, and hypokalemia   Assessment & Plan    Principal Problem:   ARF (acute renal failure) (HCC), rhabdomyolysis - Multifactorial, patient had diarrhea in the last 2 days causing dehydration, pneumonia, medications including HCTZ, losartan, has been taking NSAIDs Mobic 7.5 mg twice a day, rhabdomyolysis. Creatinine 5.42 on admission - Hold HCTZ, losartan, NSAIDs - Renal ultrasound showed increased renal echogenicity indicating above medical renal disease, slight fullness of each renal collecting system without any obstructing focus - creatinine peaked at 6.8, improving to 3.6 today - appreciate nephrology recommendations, will not start HCTZ or ACEI/ARB until creatinine has stabilized  Active Problems: Elevated troponins, prolonged QTC - Troponins peaked at 0.23, EKG showed normal sinus rhythm, LVH, prolonged QT 530 however EKG on 9/12 had shown QTC of 418.  -  Repeated EKG, showed QTC improved to 446 today - 2-D echo showed EF of 50-55% with grade 1 diastolic dysfunction, no regional wall motion abnormalities - cardiology was consulted and recommended outpatient evaluation with treadmill stress test after recovered from acute illness     Hypokalemia -potassium 3.0, replaced    Community acquired pneumonia, Legionella pneumonia - urine strep antigen negative, urine legionella antigen positive - discontinued vancomycin and Levaquin. Levaquin was discontinued due to prolonged QTC. - continue Zithromax for 10 days (d/w Dr Ilsa Iha ID) with renal dosing  - HIV negative - Zithromax increased to 500 mg daily with improvement of renal function    Hyponatremia -resolved, IV fluids discontinued    Transaminitis With hyperbilirubinemia - likely due to Legionella, right upper quadrant ultrasound showed normal appearance of gallbladder and biliary tree, no gallstones or wall thickening no Murphy sign. CBD normal 5 mm. Equally vocal increased hepatic density can be seen with mild steatosis, no focal hepatic lesion - LFTs trending down, hepatitis panel negative  Hypertension - BP improved, continue Coreg, add amlodipine back if needed - continue to hold  HCTZ, Imdur, losartan, amlodipine.   Generalized deconditioning - PT evaluation done, does not need any PT, at baseline  Diarrhea - likely from Legionella, C. Difficile colonization, does not need to be treated   Code Status:Full CODE STATUS  DVT Prophylaxis: Heparin subcutaneous  Family Communication: Discussed in detail with the patient, all imaging results, lab results explained to the patient  Disposition Plan: DC home in a.m. If creatinine improving  Time Spent in minutes   25 minutes  Procedures:  Right upper quadrant ultrasound renal ultrasound  Consultants:   Nephrology   Antimicrobials:   levaquin 9/13 -> 9/14  Azithro 9/14 ->     Medications  Scheduled Meds: . aspirin  81  mg Oral Daily  . azithromycin  500 mg Oral Daily  . carvedilol  12.5 mg Oral BID WC  . heparin  5,000 Units Subcutaneous Q8H  . ipratropium-albuterol  3 mL Nebulization TID  . [START ON 05/06/2017] pneumococcal 23 valent vaccine  0.5 mL Intramuscular Tomorrow-1000   Continuous Infusions:  PRN Meds:.HYDROcodone-homatropine, ipratropium-albuterol   Antibiotics   Anti-infectives    Start     Dose/Rate Route Frequency Ordered Stop   05/05/17 1000  azithromycin (ZITHROMAX) tablet 250 mg  Status:  Discontinued     250 mg Oral Daily 05/03/17 1252 05/05/17 0907   05/05/17 1000  azithromycin (ZITHROMAX) tablet 500 mg     500 mg Oral Daily 05/05/17 0907     05/04/17 0500  azithromycin (ZITHROMAX) tablet 500 mg     500 mg Oral  Once 05/03/17 1250 05/04/17 0604   05/04/17 0400  levofloxacin (LEVAQUIN) IVPB 500 mg  Status:  Discontinued     500 mg 100 mL/hr over 60 Minutes Intravenous Every 48 hours 05/02/17 0509 05/03/17 1250   05/02/17 0130  levofloxacin (LEVAQUIN) IVPB 750 mg     750 mg 100 mL/hr over 90 Minutes Intravenous  Once 05/02/17 0125 05/02/17 0355   05/02/17 0130  vancomycin (VANCOCIN) IVPB 1000 mg/200 mL premix     1,000 mg 200 mL/hr over 60 Minutes Intravenous  Once 05/02/17 0125 05/02/17 0325        Subjective:   Veronica Turner was seen and examined today. Feeling a lot better today, no fevers or chills.  Patient denies dizziness, chest pain, shortness of breath, D/C, new weakness, numbess, tingling.   Objective:   Vitals:   05/04/17 1500 05/04/17 2226 05/05/17 0455 05/05/17 0739  BP: 125/70 122/76 128/74   Pulse: 76 74 75 78  Resp: Temp: 99.7 F (37.6 C) 99.4 F (37.4 C) 98.3 F (36.8 C)   TempSrc: Oral Oral Oral   SpO2:  91% 95% 94%  Weight:      Height:        Intake/Output Summary (Last 24 hours) at 05/05/17 1055 Last data filed at 05/04/17 1400  Gross per 24 hour  Intake              240 ml  Output                0 ml  Net               240 ml     Wt Readings from Last 3 Encounters:  05/02/17 72.6 kg (160 lb)  05/01/17 68 kg (150 lb)  01/27/16 75.5 kg (166 lb 6.4 oz)     Exam General: Alert and oriented x 3, NAD Eyes:  HEENT:   Cardiovascular: S1 S2  auscultated, no rubs, murmurs or gallops. Regular rate and rhythm. No pedal edema b/l Respiratory: mild rhonchi b/l r>l Gastrointestinal: Soft, nontender, nondistended, + bowel sounds Ext: no pedal edema bilaterally Neuro: no new deficits Musculoskeletal: No digital cyanosis, clubbing Skin: No rashes Psych: Normal affect and demeanor, alert and oriented x3      Data Reviewed:  I have personally reviewed following labs and imaging studies  Micro Results Recent Results (from the past 240 hour(s))  Urine culture     Status: None   Collection Time: 05/02/17  1:25 AM  Result Value Ref Range Status   Specimen Description URINE, CATHETERIZED  Final   Special Requests NONE  Final   Culture NO GROWTH  Final   Report Status 05/03/2017 FINAL  Final  Culture, blood (Routine x 2)     Status: None (Preliminary result)   Collection Time: 05/02/17  2:00 AM  Result Value Ref Range Status   Specimen Description BLOOD LEFT ANTECUBITAL  Final   Special Requests   Final    BOTTLES DRAWN AEROBIC AND ANAEROBIC Blood Culture adequate volume   Culture NO GROWTH 3 DAYS  Final   Report Status PENDING  Incomplete  Culture, blood (Routine x 2)     Status: None (Preliminary result)   Collection Time: 05/02/17  3:54 AM  Result Value Ref Range Status   Specimen Description BLOOD RIGHT WRIST  Final   Special Requests   Final    BOTTLES DRAWN AEROBIC AND ANAEROBIC Blood Culture adequate volume   Culture NO GROWTH 3 DAYS  Final   Report Status PENDING  Incomplete  MRSA PCR Screening     Status: None   Collection Time: 05/03/17 12:16 PM  Result Value Ref Range Status   MRSA by PCR NEGATIVE NEGATIVE Final    Comment:        The GeneXpert MRSA Assay (FDA approved for NASAL  specimens only), is one component of a comprehensive MRSA colonization surveillance program. It is not intended to diagnose MRSA infection nor to guide or monitor treatment for MRSA infections.   C difficile quick scan w PCR reflex     Status: Abnormal   Collection Time: 05/04/17  4:24 PM  Result Value Ref Range Status   C Diff antigen POSITIVE (A) NEGATIVE Final   C Diff toxin NEGATIVE NEGATIVE Final   C Diff interpretation Results are indeterminate. See PCR results.  Final  Clostridium Difficile by PCR     Status: None   Collection Time: 05/04/17  4:24 PM  Result Value Ref Range Status   Toxigenic C Difficile by pcr NEGATIVE NEGATIVE Final    Comment: Patient is colonized with non toxigenic C. difficile. May not need treatment unless significant symptoms are present.    Radiology Reports Dg Chest 2 View  Result Date: 05/02/2017 CLINICAL DATA:  Cough and weakness for 2 days. EXAM: CHEST  2 VIEW COMPARISON:  PA and lateral chest 12/15/2015. FINDINGS: There is extensive right upper lobe airspace disease. The left lung is clear. Heart size is normal. No pneumothorax or pleural fluid. IMPRESSION: Extensive right upper lobe airspace disease consistent with pneumonia. Recommend followup to clearing. Electronically Signed   By: Drusilla Kanner M.D.   On: 05/02/2017 01:15   US Renal  Result Date: 05/02/2017 CLINICAL DATA:  Acute renal insufficiency EXAM: RENAL / URINARY TRACT ULTRASOUND COMPLETE COMPARISON:  None. FINDINGS: Right Kidney: Length: 14.1 cm. Echogenicity is increased. Renal cortical thickness is within normal limits. No mass or perinephric  fluid visualized. There is slight fullness of the right renal collecting system. No sonographically demonstrable calculus or ureterectasis on either side. Left Kidney: Length: 14.0 cm. Echogenicity is increased. Renal cortical thickness is within normal limits. No mass or perinephric fluid visualized. There is slight fullness of the left renal  collecting system. No sonographically demonstrable calculus or ureterectasis. Bladder: Appears normal for degree of bladder distention. Urinary bladder is nearly empty. IMPRESSION: Increased renal echogenicity, a finding that may be indicative of a degree of medical renal disease. Renal cortical thickness is within normal limits. There is slight fullness of each renal collecting system without obstructing focus seen on either side. The etiology for this mild bilateral collecting system fullness is uncertain. Electronically Signed   By: Bretta Bang III M.D.   On: 05/02/2017 08:00   Dg Chest Port 1v Same Day  Result Date: 05/04/2017 CLINICAL DATA:  Dyspnea. EXAM: PORTABLE CHEST 1 VIEW COMPARISON:  05/02/2017 FINDINGS: Midline trachea. Normal heart size. No pleural effusion or pneumothorax. Slight worsening right upper lobe airspace disease. There may be mild right infrahilar patchy airspace disease as well. Minimal left base volume loss. IMPRESSION: Worsened right-sided aeration, most consistent with infection or aspiration. Recommend radiographic follow-up until clearing. Electronically Signed   By: Jeronimo Greaves M.D.   On: 05/04/2017 08:23   US Abdomen Limited Ruq  Result Date: 05/02/2017 CLINICAL DATA:  Elevated LFTs. EXAM: ULTRASOUND ABDOMEN LIMITED RIGHT UPPER QUADRANT COMPARISON:  None. FINDINGS: Gallbladder: Physiologically distended. No gallstones or wall thickening visualized. No sonographic Murphy sign noted by sonographer. Common bile duct: Diameter: 5 mm, normal. Liver: No focal lesion identified. Equivocal increase in parenchymal echogenicity. Portal vein is patent on color Doppler imaging with normal direction of blood flow towards the liver. IMPRESSION: 1. Normal sonographic appearance of gallbladder and biliary tree. 2. Equivocal increased hepatic density can be seen with mild steatosis. No focal hepatic lesion. Electronically Signed   By: Rubye Oaks M.D.   On: 05/02/2017 05:42     Lab Data:  CBC:  Recent Labs Lab 05/02/17 0200 05/02/17 1113 05/03/17 0720 05/04/17 0519 05/05/17 0621  WBC 7.5 6.2 7.9 8.1 8.9  NEUTROABS 6.5  --   --   --   --   HGB 14.6 13.0 12.6 12.2 11.6*  HCT 41.9 37.9 36.5 35.5* 33.9*  MCV 89.5 90.0 88.8 88.8 88.7  PLT 180 156 186 235 264   Basic Metabolic Panel:  Recent Labs Lab 05/01/17 1627 05/02/17 0200 05/02/17 1113 05/03/17 0720 05/04/17 0519 05/05/17 0621  NA 131* 129*  --  130* 135 138  K 3.3* 3.4*  --  3.4* 3.2* 3.0*  CL 91* 91*  --  100* 108 108  CO2 22 20*  --  15* 16* 23  GLUCOSE 114* 107*  --  85 105* 118*  BUN 60* 66*  --  72* 72* 65*  CREATININE 5.42* 6.54* 6.32* 6.82* 5.44* 3.60*  CALCIUM 9.1 8.9  --  8.8* 8.4* 8.6*   GFR: Estimated Creatinine Clearance: 16.9 mL/min (A) (by C-G formula based on SCr of 3.6 mg/dL (H)). Liver Function Tests:  Recent Labs Lab 05/01/17 1627 05/02/17 0200 05/03/17 0720 05/04/17 0519 05/05/17 0621  AST 220* 225* 145* 109* 60*  ALT 95* 96* 80* 69* 57*  ALKPHOS 95 87 104 108 92  BILITOT 4.0* 4.6* 2.2* 1.4* 1.4*  PROT 7.4 7.5 6.5 5.7* 5.6*  ALBUMIN 3.1* 2.8* 2.1* 1.9* 1.9*    Recent Labs Lab 05/01/17 1627  LIPASE 35  No results for input(s): AMMONIA in the last 168 hours. Coagulation Profile:  Recent Labs Lab 05/02/17 0200  INR 1.05   Cardiac Enzymes:  Recent Labs Lab 05/02/17 1113 05/02/17 1318 05/02/17 1635 05/02/17 2024 05/03/17 0720  CKTOTAL 1,636* 1,621*  --   --  807*  CKMB 11.8*  --   --   --   --   TROPONINI 0.08*  --  0.08* 0.23* 0.05*   BNP (last 3 results) No results for input(s): PROBNP in the last 8760 hours. HbA1C: No results for input(s): HGBA1C in the last 72 hours. CBG: No results for input(s): GLUCAP in the last 168 hours. Lipid Profile: No results for input(s): CHOL, HDL, LDLCALC, TRIG, CHOLHDL, LDLDIRECT in the last 72 hours. Thyroid Function Tests:  Recent Labs  05/02/17 1318  TSH 0.625   Anemia Panel: No  results for input(s): VITAMINB12, FOLATE, FERRITIN, TIBC, IRON, RETICCTPCT in the last 72 hours. Urine analysis:    Component Value Date/Time   COLORURINE AMBER (A) 05/02/2017 0325   APPEARANCEUR CLOUDY (A) 05/02/2017 0325   LABSPEC 1.016 05/02/2017 0325   PHURINE 5.0 05/02/2017 0325   GLUCOSEU NEGATIVE 05/02/2017 0325   HGBUR LARGE (A) 05/02/2017 0325   BILIRUBINUR NEGATIVE 05/02/2017 0325   KETONESUR NEGATIVE 05/02/2017 0325   PROTEINUR 100 (A) 05/02/2017 0325   NITRITE NEGATIVE 05/02/2017 0325   LEUKOCYTESUR NEGATIVE 05/02/2017 0325     Ripudeep Rai M.D. [facility] 05/05/2017, 10:55 AM  Pager: [phone] Between 7am to 7pm - call Pager - [phone]  After 7pm go to [email or web address] - password [email or web address]  Call night coverage person covering after 7pm

## 2017-05-05 NOTE — Progress Notes (Addendum)
[facility] Progress Note    Assessment/ Plan:   1. Legionella pneumonia: s/p IV levaquin, vanc, and now on zithromax.    2.  Diarrhea: supportive, colonized with C diff  3.  Acute kidney injury: multifactorial- was on HCTZ, ARB, and COX-2 inhibitor PTA. Renal US without obstruction.  Getting better, with post-ATN diuresis being observed.  Liberalize fluid restriction.  Don't recommend restarting ARB/ HCTZ until Cr normalizes  4.  Hypertension/dCHF: agree with coreg in setting of grade 1 DD. Would add amlodipine next it something is needed.   Don't restart ARB/HCT until Cr normalized.  5.  Metabolic acidosis: likely from AKI.  Stopping bicarb  6  Hypokalemia: supplementing  7.  Elevated LFTs: RUQ U/S without cholecystitis  8.  Hyponatremia: resolved  9.  Wheezing: resolved  10. Dispo: we will sign off.  Please don't hesitate to contact us with concerns.    Subjective:    Feeling better.  Cr coming down.  Colonized with C diff     Objective:   BP 128/74 (BP Location: Right Arm)   Pulse 78   Temp 98.3 F (36.8 C) (Oral)   Resp 16   Ht  (1.6 m)   Wt 72.6 kg (160 lb)   LMP 07/01/2014   SpO2 94%   BMI 28.34 kg/m   Intake/Output Summary (Last 24 hours) at 05/05/17 0912 Last data filed at 05/04/17 1400  Gross per 24 hour  Intake              600 ml  Output                0 ml  Net              600 ml   Weight change:   Physical Exam:  GEN NAD, appears a little tired HEENT EOMI, PERRL NECK no JVD PULM R sided anterior crackles, posterior as well.  L lung clear.  No wheezing on my exam CV RRR no m/r/g ABD benign EXT trace LE edema NEURO AAO x 3   Imaging: Dg Chest Port 1v Same Day  Result Date: 05/04/2017 CLINICAL DATA:  Dyspnea. EXAM: PORTABLE CHEST 1 VIEW COMPARISON:  05/02/2017 FINDINGS: Midline trachea. Normal heart size. No pleural effusion or pneumothorax. Slight worsening right upper lobe airspace disease. There may be mild right  infrahilar patchy airspace disease as well. Minimal left base volume loss. IMPRESSION: Worsened right-sided aeration, most consistent with infection or aspiration. Recommend radiographic follow-up until clearing. Electronically Signed   By: Jeronimo Greaves M.D.   On: 05/04/2017 08:23    Labs: BMET  Recent Labs Lab 05/01/17 1627 05/02/17 0200 05/02/17 1113 05/03/17 0720 05/04/17 0519 05/05/17 0621  NA 131* 129*  --  130* 135 138  K 3.3* 3.4*  --  3.4* 3.2* 3.0*  CL 91* 91*  --  100* 108 108  CO2 22 20*  --  15* 16* 23  GLUCOSE 114* 107*  --  85 105* 118*  BUN 60* 66*  --  72* 72* 65*  CREATININE 5.42* 6.54* 6.32* 6.82* 5.44* 3.60*  CALCIUM 9.1 8.9  --  8.8* 8.4* 8.6*   CBC  Recent Labs Lab 05/02/17 0200 05/02/17 1113 05/03/17 0720 05/04/17 0519 05/05/17 0621  WBC 7.5 6.2 7.9 8.1 8.9  NEUTROABS 6.5  --   --   --   --   HGB 14.6 13.0 12.6 12.2 11.6*  HCT 41.9 37.9 36.5 35.5* 33.9*  MCV 89.5 90.0  88.8 88.8 88.7  PLT 180 156 186 235 264    Medications:    . aspirin  81 mg Oral Daily  . azithromycin  500 mg Oral Daily  . carvedilol  12.5 mg Oral BID WC  . heparin  5,000 Units Subcutaneous Q8H  . ipratropium-albuterol  3 mL Nebulization TID  . potassium chloride  30 mEq Oral Once      Bufford Buttner, MD [facility] pgr [phone] 05/05/2017, 9:12 AM

## 2017-05-06 LAB — COMPREHENSIVE METABOLIC PANEL
ALK PHOS: 95 U/L (ref 38–126)
ALT: 58 U/L — AB (ref 14–54)
AST: 53 U/L — AB (ref 15–41)
Albumin: 2.2 g/dL — ABNORMAL LOW (ref 3.5–5.0)
Anion gap: 7 (ref 5–15)
BUN: 52 mg/dL — AB (ref 6–20)
CALCIUM: 9 mg/dL (ref 8.9–10.3)
CHLORIDE: 109 mmol/L (ref 101–111)
CO2: 24 mmol/L (ref 22–32)
CREATININE: 2.22 mg/dL — AB (ref 0.44–1.00)
GFR, EST AFRICAN AMERICAN: 28 mL/min — AB (ref >60)
GFR, EST NON AFRICAN AMERICAN: 24 mL/min — AB (ref >60)
Glucose, Bld: 121 mg/dL — ABNORMAL HIGH (ref 65–99)
Potassium: 3.2 mmol/L — ABNORMAL LOW (ref 3.5–5.1)
Sodium: 140 mmol/L (ref 135–145)
Total Bilirubin: 1.2 mg/dL (ref 0.3–1.2)
Total Protein: 6.6 g/dL (ref 6.5–8.1)

## 2017-05-06 MED ORDER — AZITHROMYCIN 500 MG PO TABS: 500.0000 mg | ORAL_TABLET | Freq: Every day | ORAL | 0 refills | Status: DC

## 2017-05-06 MED ORDER — GUAIFENESIN ER 600 MG PO TB12: 1200.0000 mg | ORAL_TABLET | Freq: Two times a day (BID) | ORAL | 0 refills | Status: DC

## 2017-05-06 MED ORDER — SENNOSIDES-DOCUSATE SODIUM 8.6-50 MG PO TABS: 1.0000 | ORAL_TABLET | Freq: Every evening | ORAL | 0 refills | Status: DC | PRN

## 2017-05-06 MED ORDER — ISOSORBIDE MONONITRATE ER 60 MG PO TB24
60.0000 mg | ORAL_TABLET | Freq: Every day | ORAL | Status: DC
  Administered 2017-05-06: 60 mg via ORAL
  Filled 2017-05-06: qty 1

## 2017-05-06 MED ORDER — GUAIFENESIN-DM 100-10 MG/5ML PO SYRP: 5.0000 mL | ORAL_SOLUTION | ORAL | 0 refills | Status: DC | PRN

## 2017-05-06 MED ORDER — CARVEDILOL 12.5 MG PO TABS: 12.5000 mg | ORAL_TABLET | Freq: Two times a day (BID) | ORAL | 1 refills | Status: DC

## 2017-05-06 MED ORDER — AMLODIPINE BESYLATE 5 MG PO TABS: 5.0000 mg | ORAL_TABLET | Freq: Every day | ORAL | 1 refills | Status: DC

## 2017-05-06 MED ORDER — ISOSORBIDE MONONITRATE ER 60 MG PO TB24: 60.0000 mg | ORAL_TABLET | Freq: Every day | ORAL | 1 refills | Status: DC

## 2017-05-06 MED ORDER — AMLODIPINE BESYLATE 5 MG PO TABS
5.0000 mg | ORAL_TABLET | Freq: Every day | ORAL | Status: DC
  Administered 2017-05-06: 5 mg via ORAL
  Filled 2017-05-06: qty 1

## 2017-05-06 MED ORDER — ALBUTEROL SULFATE HFA 108 (90 BASE) MCG/ACT IN AERS: 2.0000 | INHALATION_SPRAY | Freq: Four times a day (QID) | RESPIRATORY_TRACT | 2 refills | Status: DC | PRN

## 2017-05-06 MED ORDER — SENNOSIDES-DOCUSATE SODIUM 8.6-50 MG PO TABS: 1.0000 | ORAL_TABLET | Freq: Every evening | ORAL | Status: DC | PRN

## 2017-05-06 MED ORDER — BENZONATATE 100 MG PO CAPS: 100.0000 mg | ORAL_CAPSULE | Freq: Three times a day (TID) | ORAL | 0 refills | Status: DC | PRN

## 2017-05-06 MED ORDER — GUAIFENESIN ER 600 MG PO TB12
1200.0000 mg | ORAL_TABLET | Freq: Two times a day (BID) | ORAL | Status: DC
  Administered 2017-05-06: 1200 mg via ORAL
  Filled 2017-05-06: qty 2

## 2017-05-06 MED ORDER — POTASSIUM CHLORIDE CRYS ER 20 MEQ PO TBCR
40.0000 meq | EXTENDED_RELEASE_TABLET | Freq: Once | ORAL | Status: AC
Start: 2017-05-06 — End: 2017-05-06
  Administered 2017-05-06: 40 meq via ORAL
  Filled 2017-05-06: qty 2

## 2017-05-06 NOTE — Discharge Summary (Signed)
Physician Discharge Summary   Patient ID: Phylis Javed MRN: [id number] DOB/AGE: 1960/09/28 56 y.o.  Admit date: 05/02/2017 Discharge date: 05/06/2017  Primary Care Physician:  Patient, No Pcp Per  Discharge Diagnoses:    . ARF (acute renal failure) (HCC) . Community acquired pneumonia- Legionella pneumonia . Hypokalemia . Hyponatremia . Transaminitis  essential hypertension   Diarrhea resolved  Consults:  Nephrology, Dr. Signe Colt  Recommendations for Outpatient Follow-up:  1. Case management to arrange outpatient [facility], needs BMET to be checked in 1 week 2. Please repeat CBC/BMET at next visit 3. Please check repeat chest x-ray in 4 weeks to ensure complete resolution of pneumonia 4. HCTZ, losartan, meloxicam discontinued/ held until creatinine function has completely normalized  DIET: heart healthy diet    Allergies:   Allergies  Allergen Reactions  . Penicillins Anaphylaxis    Throat closes  Has patient had a PCN reaction causing immediate rash, facial/tongue/throat swelling, SOB or lightheadedness with hypotension: Yes Has patient had a PCN reaction causing severe rash involving mucus membranes or skin necrosis: Unknown Has patient had a PCN reaction that required hospitalization: Yes Has patient had a PCN reaction occurring within the last 10 years: No If all of the above answers are "NO", then may proceed with Cephalosporin use.      DISCHARGE MEDICATIONS: Current Discharge Medication List    START taking these medications   Details  albuterol (PROVENTIL HFA;VENTOLIN HFA) 108 (90 Base) MCG/ACT inhaler Inhale 2 puffs into the lungs every 6 (six) hours as needed for wheezing or shortness of breath. Qty: 1 Inhaler, Refills: 2    azithromycin (ZITHROMAX) 500 MG tablet Take 1 tablet (500 mg total) by mouth daily. X 8 more days Qty: 8 tablet, Refills: 0    guaiFENesin (MUCINEX) 600 MG 12 hr tablet Take 2 tablets (1,200 mg  total) by mouth 2 (two) times daily. Qty: 60 tablet, Refills: 0    guaiFENesin-dextromethorphan (ROBITUSSIN DM) 100-10 MG/5ML syrup Take 5 mLs by mouth every 4 (four) hours as needed for cough. Qty: 118 mL, Refills: 0    senna-docusate (SENOKOT-S) 8.6-50 MG tablet Take 1 tablet by mouth at bedtime as needed for mild constipation. Qty: 30 tablet, Refills: 0      CONTINUE these medications which have CHANGED   Details  amLODipine (NORVASC) 5 MG tablet Take 1 tablet (5 mg total) by mouth daily. Qty: 30 tablet, Refills: 1    benzonatate (TESSALON) 100 MG capsule Take 1 capsule (100 mg total) by mouth 3 (three) times daily as needed for cough. Qty: 30 capsule, Refills: 0    carvedilol (COREG) 12.5 MG tablet Take 1 tablet (12.5 mg total) by mouth 2 (two) times daily with a meal. Qty: 60 tablet, Refills: 1    isosorbide mononitrate (IMDUR) 60 MG 24 hr tablet Take 1 tablet (60 mg total) by mouth daily. Qty: 30 tablet, Refills: 1   Associated Diagnoses: Essential hypertension      CONTINUE these medications which have NOT CHANGED   Details  aspirin 81 MG tablet Take 1 tablet (81 mg total) by mouth daily. Qty: 30 tablet, Refills: 3   Associated Diagnoses: Essential hypertension    fluticasone (FLONASE) 50 MCG/ACT nasal spray Place 2 sprays into both nostrils daily. Qty: 1 g, Refills: 0      STOP taking these medications     hydrochlorothiazide (HYDRODIURIL) 25 MG tablet      losartan (COZAAR) 100 MG tablet      meloxicam (MOBIC)  7.5 MG tablet          Brief H and P: For complete details please refer to admission H and P, but in brief Per Dr. Elmyra Ricks admit note on 9/13  AnnetteJacksonis a 56 y.o.female,w hypertension, c/o cough for the past 1 week, and dyspnea. Cough productive yellow sputum. + fever for the past 1 day. Slight diarrhea, "loose stool" for the past 2 days. Pt denies cp, palp, n/v, abd pain, brbpr. Pt had been to urgent care on 9/7 and treated with  tessalon pearls.  In ED, CXR showed extensive RUL pneumonia. Pt wbc 7.5, Bun/creatinine 66/6.54, Glucose 107, Pt will be admitted for ARF, and pneumonia, and hypokalemia  Hospital Course:  ARF (acute renal failure) (HCC), rhabdomyolysis - Multifactorial, patient had diarrhea in the last 2 days causing dehydration, pneumonia, medications including HCTZ, losartan, has been taking NSAIDs Mobic 7.5 mg twice a day, rhabdomyolysis. Creatinine 5.42 on admission - Renal ultrasound showed increased renal echogenicity indicating above medical renal disease, slight fullness of each renal collecting system without any obstructing focus - creatinine peaked at 6.8, improved to 2.2 at the time of discharge. -continue to hold HCTZ or ACEI/ARB until creatinine has stabilized, discontinued meloxicam  Active Problems: Elevated troponins, prolonged QTC - Troponins peaked at 0.23, EKG showed normal sinus rhythm, LVH, prolonged QT 530 however EKG on 9/12 had shown QTC of 418.  - Repeated EKG, showed QTC improved to 446 - 2-D echo showed EF of 50-55% with grade 1 diastolic dysfunction, no regional wall motion abnormalities - cardiology was consulted and recommended outpatient evaluation with treadmill stress test after recovered from acute illness     Hypokalemia -replaced    Community acquired pneumonia, Legionella pneumonia - urine strep antigen negative, urine legionella antigen positive - discontinued vancomycin and Levaquin. Levaquin was discontinued due to prolonged QTC. - continue Zithromax for 10 days (d/w Dr Ilsa Iha ID) with renal dosing  - HIV negative - Zithromax increased to 500 mg daily with improvement of renal function    Hyponatremia -resolved, IV fluids discontinued    Transaminitis With hyperbilirubinemia - likely due to Legionella, right upper quadrant ultrasound showed normal appearance of gallbladder and biliary tree, no gallstones or wall thickening no Murphy sign. CBD normal 5  mm. Equally vocal increased hepatic density can be seen with mild steatosis, no focal hepatic lesion - LFTs trending down, hepatitis panel negative  Hypertension - BP improved, continue Coreg, amlodipine, Imdur  Generalized deconditioning - PT evaluation done, does not need any PT, at baseline  Diarrhea - likely from Legionella, C. Difficile colonization, does not need to be treated  Day of Discharge BP (!) 140/99 (BP Location: Left Arm)   Pulse 75   Temp 97.9 F (36.6 C) (Oral)   Resp 18   Ht  (1.6 m)   Wt 72.6 kg (160 lb)   LMP 07/01/2014   SpO2 94%   BMI 28.34 kg/m   Physical Exam: General: Alert and awake oriented x3 not in any acute distress. HEENT: anicteric sclera, pupils reactive to light and accommodation CVS: S1-S2 clear no murmur rubs or gallops Chest: clear to auscultation bilaterally, no wheezing rales or rhonchi Abdomen: soft nontender, nondistended, normal bowel sounds Extremities: no cyanosis, clubbing or edema noted bilaterally Neuro: Cranial nerves II-XII intact, no focal neurological deficits   The results of significant diagnostics from this hospitalization (including imaging, microbiology, ancillary and laboratory) are listed below for reference.    LAB RESULTS: Basic Metabolic Panel:  Recent  Labs Lab 05/05/17 0621 05/06/17 0434  NA 138 140  K 3.0* 3.2*  CL 108 109  CO2 23 24  GLUCOSE 118* 121*  BUN 65* 52*  CREATININE 3.60* 2.22*  CALCIUM 8.6* 9.0   Liver Function Tests:  Recent Labs Lab 05/05/17 0621 05/06/17 0434  AST 60* 53*  ALT 57* 58*  ALKPHOS 92 95  BILITOT 1.4* 1.2  PROT 5.6* 6.6  ALBUMIN 1.9* 2.2*    Recent Labs Lab 05/01/17 1627  LIPASE 35   No results for input(s): AMMONIA in the last 168 hours. CBC:  Recent Labs Lab 05/02/17 0200  05/04/17 0519 05/05/17 0621  WBC 7.5  < > 8.1 8.9  NEUTROABS 6.5  --   --   --   HGB 14.6  < > 12.2 11.6*  HCT 41.9  < > 35.5* 33.9*  MCV 89.5  < > 88.8 88.7  PLT  180  < > 235 264  < > = values in this interval not displayed. Cardiac Enzymes:  Recent Labs Lab 05/02/17 1113 05/02/17 1318  05/02/17 2024 05/03/17 0720  CKTOTAL 1,636* 1,621*  --   --  807*  CKMB 11.8*  --   --   --   --   TROPONINI 0.08*  --   < > 0.23* 0.05*  < > = values in this interval not displayed. BNP: Invalid input(s): POCBNP CBG: No results for input(s): GLUCAP in the last 168 hours.  Significant Diagnostic Studies:  Dg Chest 2 View  Result Date: 05/02/2017 CLINICAL DATA:  Cough and weakness for 2 days. EXAM: CHEST  2 VIEW COMPARISON:  PA and lateral chest 12/15/2015. FINDINGS: There is extensive right upper lobe airspace disease. The left lung is clear. Heart size is normal. No pneumothorax or pleural fluid. IMPRESSION: Extensive right upper lobe airspace disease consistent with pneumonia. Recommend followup to clearing. Electronically Signed   By: Drusilla Kanner M.D.   On: 05/02/2017 01:15   US Renal  Result Date: 05/02/2017 CLINICAL DATA:  Acute renal insufficiency EXAM: RENAL / URINARY TRACT ULTRASOUND COMPLETE COMPARISON:  None. FINDINGS: Right Kidney: Length: 14.1 cm. Echogenicity is increased. Renal cortical thickness is within normal limits. No mass or perinephric fluid visualized. There is slight fullness of the right renal collecting system. No sonographically demonstrable calculus or ureterectasis on either side. Left Kidney: Length: 14.0 cm. Echogenicity is increased. Renal cortical thickness is within normal limits. No mass or perinephric fluid visualized. There is slight fullness of the left renal collecting system. No sonographically demonstrable calculus or ureterectasis. Bladder: Appears normal for degree of bladder distention. Urinary bladder is nearly empty. IMPRESSION: Increased renal echogenicity, a finding that may be indicative of a degree of medical renal disease. Renal cortical thickness is within normal limits. There is slight fullness of each renal  collecting system without obstructing focus seen on either side. The etiology for this mild bilateral collecting system fullness is uncertain. Electronically Signed   By: Bretta Bang III M.D.   On: 05/02/2017 08:00   US Abdomen Limited Ruq  Result Date: 05/02/2017 CLINICAL DATA:  Elevated LFTs. EXAM: ULTRASOUND ABDOMEN LIMITED RIGHT UPPER QUADRANT COMPARISON:  None. FINDINGS: Gallbladder: Physiologically distended. No gallstones or wall thickening visualized. No sonographic Murphy sign noted by sonographer. Common bile duct: Diameter: 5 mm, normal. Liver: No focal lesion identified. Equivocal increase in parenchymal echogenicity. Portal vein is patent on color Doppler imaging with normal direction of blood flow towards the liver. IMPRESSION: 1. Normal sonographic appearance of gallbladder  and biliary tree. 2. Equivocal increased hepatic density can be seen with mild steatosis. No focal hepatic lesion. Electronically Signed   By: Rubye Oaks M.D.   On: 05/02/2017 05:42    2D ECHO: Study Conclusions  - Left ventricle: The cavity size was normal. Systolic function was   normal. The estimated ejection fraction was in the range of 50%   to 55%. Wall motion was normal; there were no regional wall   motion abnormalities. Doppler parameters are consistent with   abnormal left ventricular relaxation (grade 1 diastolic   dysfunction). Doppler parameters are consistent with   indeterminate ventricular filling pressure. - Aortic valve: Transvalvular velocity was within the normal range.   There was no stenosis. There was no regurgitation. - Mitral valve: Transvalvular velocity was within the normal range.   There was no evidence for stenosis. There was no regurgitation. - Right ventricle: The cavity size was normal. Wall thickness was   normal. Systolic function was normal. - Tricuspid valve: There was trivial regurgitation. - Pulmonary arteries: Systolic pressure was within the normal    range. PA peak pressure: 32 mm Hg (S).  Disposition and Follow-up: Discharge Instructions    Diet - low sodium heart healthy    Complete by:  As directed    Increase activity slowly    Complete by:  As directed        DISPOSITION:home    DISCHARGE FOLLOW-UP Follow-up Information    [facility]. Schedule an appointment as soon as possible for a visit in 1 week(s).   Why:  need BMET checked and follow-up Contact information: [address] [place] [address] [phone]           Time spent on Discharge:   Signed:   Thad Ranger M.D. [facility] 05/06/2017, 12:11 PM Pager: [phone]

## 2017-05-06 NOTE — Progress Notes (Signed)
Physical Therapy Treatment Patient Details Name: Veronica Turner MRN: [id number] DOB: 1961/03/05 Today's Date: 05/06/2017    History of Present Illness Pt is a 56 y/o female admitted secondary to fatigue and cough x1 week. Pt found to have acute renal failure and CAP. PMH including but not limited to HTN and TIA.    PT Comments    Patient demonstrates improvements with functional mobility indicated by independence with transfers and increased distance with ambulation. Patient able to ambulate 280 feet with min guard assist for safety. Pt continues to utilize rails/countertops intermittently during ambulation as well. Pt is aware of deficits and reports fatigue often throughout session and takes rest breaks accordingly. Current PT recommendations are still appropriate.   Follow Up Recommendations  No PT follow up     Equipment Recommendations  None recommended by PT    Recommendations for Other Services       Precautions / Restrictions Precautions Precautions: None Restrictions Weight Bearing Restrictions: No    Mobility  Bed Mobility               General bed mobility comments: Pt received OOB in recliner  Transfers Overall transfer level: Independent                  Ambulation/Gait Ambulation/Gait assistance: Min guard Ambulation Distance (Feet): 280 Feet Assistive device: None Gait Pattern/deviations: Step-through pattern;Trunk flexed;Decreased stride length Gait velocity: decreased Gait velocity interpretation: Below normal speed for age/gender General Gait Details: cautious and slow, used countertops and rails intermittently for support; no LOB noted   Stairs Stairs: Yes   Stair Management: Two rails;Alternating pattern;Step to pattern Number of Stairs: 4 General stair comments: Pt able to ascend with alternating pattern. Descend with step to pattern and needed standing rest break half way through with forward trunk lean and bilateral extremity  support from the rail. Supervision for safety.  Wheelchair Mobility    Modified Rankin (Stroke Patients Only)       Balance Overall balance assessment: Needs assistance Sitting-balance support: Feet supported Sitting balance-Leahy Scale: Good     Standing balance support: During functional activity Standing balance-Leahy Scale: Fair Standing balance comment: able to maintain static standing with no significant LOB noted                            Cognition Arousal/Alertness: Awake/alert Behavior During Therapy: WFL for tasks assessed/performed Overall Cognitive Status: Within Functional Limits for tasks assessed                                 General Comments: cognition not formally assessed but [facility] for general conversation      Exercises      General Comments        Pertinent Vitals/Pain Pain Assessment: Faces Faces Pain Scale: Hurts a little bit Pain Location: generalized Pain Descriptors / Indicators: Sore (stiff) Pain Intervention(s): Monitored during session    Home Living                      Prior Function            PT Goals (current goals can now be found in the care plan section) Acute Rehab PT Goals Patient Stated Goal: feel better and go home PT Goal Formulation: With patient Time For Goal Achievement: 05/18/17 Potential to Achieve Goals: Good Progress towards PT goals: Progressing  toward goals    Frequency    Min 3X/week      PT Plan Current plan remains appropriate    Co-evaluation              AM-PAC PT "6 Clicks" Daily Activity  Outcome Measure  Difficulty turning over in bed (including adjusting bedclothes, sheets and blankets)?: None Difficulty moving from lying on back to sitting on the side of the bed? : None Difficulty sitting down on and standing up from a chair with arms (e.g., wheelchair, bedside commode, etc,.)?: None Help needed moving to and from a bed to chair (including a  wheelchair)?: None Help needed walking in hospital room?: A Little Help needed climbing 3-5 steps with a railing? : A Little 6 Click Score: 22    End of Session Equipment Utilized During Treatment: Gait belt Activity Tolerance: Patient tolerated treatment well Patient left: in bed;with call bell/phone within reach Nurse Communication: Mobility status PT Visit Diagnosis: Other abnormalities of gait and mobility (R26.89)     Time: 0272-5366 PT Time Calculation (min) (ACUTE ONLY): 14 min  Charges:  $Gait Training: 8-22 mins                    G CodesMckinley Jewel, SPT [phone] office    Fonnie Birkenhead 05/06/2017, 9:36 AM

## 2017-05-06 NOTE — Care Management Note (Addendum)
Case Management Note  Patient Details  Name: Rain Wilhide MRN: [id number] Date of Birth: 1961/07/11  Subjective/Objective:                 Admitted with acute renal failure.   Action/Plan: Plan is to d/c to home today. CM to f/u with obtaining hospital f/u appointment with [facility]. Call attempted by CM, however, no answer.Pt states was active with clinic a few yrs ago. Once appointment received/scheduled CM to call pt @ [phone] with appointment time. Match Letter given to pt 2/2 pt states unable to afford Rx medications.   Expected Discharge Date:  05/06/17               Expected Discharge Plan:  Home/Self Care  In-House Referral:     Discharge planning Services  CM Consult, [facility], [facility], Medication Assistance  Post Acute Care Choice:    Choice offered to:     DME Arranged:    DME Agency:     HH Arranged:    HH Agency:     Status of Service:  Completed, signed off  If discussed at [employer], dates discussed:    Additional Comments:  Epifanio Lesches, RN 05/06/2017, 1:02 PM

## 2017-05-07 LAB — CULTURE, BLOOD (ROUTINE X 2)
Culture: NO GROWTH
Culture: NO GROWTH
SPECIAL REQUESTS: ADEQUATE
SPECIAL REQUESTS: ADEQUATE

## 2017-05-07 NOTE — Progress Notes (Signed)
Post hospital follow up scheduled for 05/29/2017 at 9:30am with Julianne Handler NP. Discharging MD made aware and pt.  Gae Gallop RN,BSN,CM

## 2017-05-08 LAB — LEGIONELLA PNEUMOPHILA SEROGP 1 UR AG: L. PNEUMOPHILA SEROGP 1 UR AG: POSITIVE — AB

## 2017-05-09 LAB — STOOL CULTURE REFLEX - RSASHR

## 2017-05-09 LAB — STOOL CULTURE: E COLI SHIGA TOXIN ASSAY: NEGATIVE

## 2017-05-09 LAB — STOOL CULTURE REFLEX - CMPCXR

## 2017-05-29 ENCOUNTER — Ambulatory Visit: Payer: No Typology Code available for payment source | Admitting: Family Medicine

## 2017-08-28 ENCOUNTER — Ambulatory Visit: Payer: BLUE CROSS/BLUE SHIELD | Attending: Nurse Practitioner | Admitting: Nurse Practitioner

## 2017-08-28 ENCOUNTER — Encounter: Payer: Self-pay | Admitting: Nurse Practitioner

## 2017-08-28 VITALS — BP 179/109 | HR 74 | Temp 97.9°F | Resp 12 | Ht 64.0 in | Wt 193.6 lb

## 2017-08-28 DIAGNOSIS — Z1211 Encounter for screening for malignant neoplasm of colon: Secondary | ICD-10-CM

## 2017-08-28 DIAGNOSIS — Z6833 Body mass index (BMI) 33.0-33.9, adult: Secondary | ICD-10-CM | POA: Diagnosis not present

## 2017-08-28 DIAGNOSIS — Z880 Allergy status to penicillin: Secondary | ICD-10-CM | POA: Diagnosis not present

## 2017-08-28 DIAGNOSIS — Z7951 Long term (current) use of inhaled steroids: Secondary | ICD-10-CM | POA: Insufficient documentation

## 2017-08-28 DIAGNOSIS — M25512 Pain in left shoulder: Secondary | ICD-10-CM | POA: Insufficient documentation

## 2017-08-28 DIAGNOSIS — M25511 Pain in right shoulder: Secondary | ICD-10-CM | POA: Diagnosis not present

## 2017-08-28 DIAGNOSIS — R0602 Shortness of breath: Secondary | ICD-10-CM | POA: Insufficient documentation

## 2017-08-28 DIAGNOSIS — Z7982 Long term (current) use of aspirin: Secondary | ICD-10-CM | POA: Diagnosis not present

## 2017-08-28 DIAGNOSIS — Z1231 Encounter for screening mammogram for malignant neoplasm of breast: Secondary | ICD-10-CM

## 2017-08-28 DIAGNOSIS — E669 Obesity, unspecified: Secondary | ICD-10-CM

## 2017-08-28 DIAGNOSIS — Z8673 Personal history of transient ischemic attack (TIA), and cerebral infarction without residual deficits: Secondary | ICD-10-CM | POA: Diagnosis not present

## 2017-08-28 DIAGNOSIS — I10 Essential (primary) hypertension: Secondary | ICD-10-CM | POA: Diagnosis not present

## 2017-08-28 DIAGNOSIS — Z8249 Family history of ischemic heart disease and other diseases of the circulatory system: Secondary | ICD-10-CM | POA: Diagnosis not present

## 2017-08-28 DIAGNOSIS — M1990 Unspecified osteoarthritis, unspecified site: Secondary | ICD-10-CM | POA: Diagnosis not present

## 2017-08-28 DIAGNOSIS — Z79899 Other long term (current) drug therapy: Secondary | ICD-10-CM | POA: Insufficient documentation

## 2017-08-28 MED ORDER — CARVEDILOL 12.5 MG PO TABS: 12.5000 mg | ORAL_TABLET | Freq: Two times a day (BID) | ORAL | 3 refills | Status: DC

## 2017-08-28 MED ORDER — CLONIDINE HCL 0.1 MG PO TABS
0.2000 mg | ORAL_TABLET | Freq: Once | ORAL | Status: AC
  Administered 2017-08-28: 0.2 mg via ORAL

## 2017-08-28 MED ORDER — CLONIDINE HCL 0.1 MG PO TABS: 0.2000 mg | ORAL_TABLET | Freq: Every day | ORAL | Status: DC

## 2017-08-28 MED ORDER — SENNOSIDES-DOCUSATE SODIUM 8.6-50 MG PO TABS: 1.0000 | ORAL_TABLET | Freq: Every evening | ORAL | 3 refills | Status: AC | PRN

## 2017-08-28 MED ORDER — AMLODIPINE BESYLATE 5 MG PO TABS: 5.0000 mg | ORAL_TABLET | Freq: Every day | ORAL | 3 refills | Status: DC

## 2017-08-28 MED ORDER — ISOSORBIDE MONONITRATE ER 60 MG PO TB24: 60.0000 mg | ORAL_TABLET | Freq: Every day | ORAL | 1 refills | Status: DC

## 2017-08-28 NOTE — Patient Instructions (Addendum)
DASH Eating Plan DASH stands for "Dietary Approaches to Stop Hypertension." The DASH eating plan is a healthy eating plan that has been shown to reduce high blood pressure (hypertension). It may also reduce your risk for type 2 diabetes, heart disease, and stroke. The DASH eating plan may also help with weight loss. What are tips for following this plan? General guidelines  Avoid eating more than 2,300 mg (milligrams) of salt (sodium) a day. If you have hypertension, you may need to reduce your sodium intake to 1,500 mg a day.  Limit alcohol intake to no more than 1 drink a day for nonpregnant women and 2 drinks a day for men. One drink equals 12 oz of beer, 5 oz of wine, or 1 oz of hard liquor.  Work with your health care provider to maintain a healthy body weight or to lose weight. Ask what an ideal weight is for you.  Get at least 30 minutes of exercise that causes your heart to beat faster (aerobic exercise) most days of the week. Activities may include walking, swimming, or biking.  Work with your health care provider or diet and nutrition specialist (dietitian) to adjust your eating plan to your individual calorie needs. Reading food labels  Check food labels for the amount of sodium per serving. Choose foods with less than 5 percent of the Daily Value of sodium. Generally, foods with less than 300 mg of sodium per serving fit into this eating plan.  To find whole grains, look for the word "whole" as the first word in the ingredient list. Shopping  Buy products labeled as "low-sodium" or "no salt added."  Buy fresh foods. Avoid canned foods and premade or frozen meals. Cooking  Avoid adding salt when cooking. Use salt-free seasonings or herbs instead of table salt or sea salt. Check with your health care provider or pharmacist before using salt substitutes.  Do not fry foods. Cook foods using healthy methods such as baking, boiling, grilling, and broiling instead.  Cook with  heart-healthy oils, such as olive, canola, soybean, or sunflower oil. Meal planning   Eat a balanced diet that includes: ? 5 or more servings of fruits and vegetables each day. At each meal, try to fill half of your plate with fruits and vegetables. ? Up to 6-8 servings of whole grains each day. ? Less than 6 oz of lean meat, poultry, or fish each day. A 3-oz serving of meat is about the same size as a deck of cards. One egg equals 1 oz. ? 2 servings of low-fat dairy each day. ? A serving of nuts, seeds, or beans 5 times each week. ? Heart-healthy fats. Healthy fats called Omega-3 fatty acids are found in foods such as flaxseeds and coldwater fish, like sardines, salmon, and mackerel.  Limit how much you eat of the following: ? Canned or prepackaged foods. ? Food that is high in trans fat, such as fried foods. ? Food that is high in saturated fat, such as fatty meat. ? Sweets, desserts, sugary drinks, and other foods with added sugar. ? Full-fat dairy products.  Do not salt foods before eating.  Try to eat at least 2 vegetarian meals each week.  Eat more home-cooked food and less restaurant, buffet, and fast food.  When eating at a restaurant, ask that your food be prepared with less salt or no salt, if possible. What foods are recommended? The items listed may not be a complete list. Talk with your dietitian about what   dietary choices are best for you. Grains Whole-grain or whole-wheat bread. Whole-grain or whole-wheat pasta. Brown rice. Oatmeal. Quinoa. Bulgur. Whole-grain and low-sodium cereals. Pita bread. Low-fat, low-sodium crackers. Whole-wheat flour tortillas. Vegetables Fresh or frozen vegetables (raw, steamed, roasted, or grilled). Low-sodium or reduced-sodium tomato and vegetable juice. Low-sodium or reduced-sodium tomato sauce and tomato paste. Low-sodium or reduced-sodium canned vegetables. Fruits All fresh, dried, or frozen fruit. Canned fruit in natural juice (without  added sugar). Meat and other protein foods Skinless chicken or turkey. Ground chicken or turkey. Pork with fat trimmed off. Fish and seafood. Egg whites. Dried beans, peas, or lentils. Unsalted nuts, nut butters, and seeds. Unsalted canned beans. Lean cuts of beef with fat trimmed off. Low-sodium, lean deli meat. Dairy Low-fat (1%) or fat-free (skim) milk. Fat-free, low-fat, or reduced-fat cheeses. Nonfat, low-sodium ricotta or cottage cheese. Low-fat or nonfat yogurt. Low-fat, low-sodium cheese. Fats and oils Soft margarine without trans fats. Vegetable oil. Low-fat, reduced-fat, or light mayonnaise and salad dressings (reduced-sodium). Canola, safflower, olive, soybean, and sunflower oils. Avocado. Seasoning and other foods Herbs. Spices. Seasoning mixes without salt. Unsalted popcorn and pretzels. Fat-free sweets. What foods are not recommended? The items listed may not be a complete list. Talk with your dietitian about what dietary choices are best for you. Grains Baked goods made with fat, such as croissants, muffins, or some breads. Dry pasta or rice meal packs. Vegetables Creamed or fried vegetables. Vegetables in a cheese sauce. Regular canned vegetables (not low-sodium or reduced-sodium). Regular canned tomato sauce and paste (not low-sodium or reduced-sodium). Regular tomato and vegetable juice (not low-sodium or reduced-sodium). Pickles. Olives. Fruits Canned fruit in a light or heavy syrup. Fried fruit. Fruit in cream or butter sauce. Meat and other protein foods Fatty cuts of meat. Ribs. Fried meat. Bacon. Sausage. Bologna and other processed lunch meats. Salami. Fatback. Hotdogs. Bratwurst. Salted nuts and seeds. Canned beans with added salt. Canned or smoked fish. Whole eggs or egg yolks. Chicken or turkey with skin. Dairy Whole or 2% milk, cream, and half-and-half. Whole or full-fat cream cheese. Whole-fat or sweetened yogurt. Full-fat cheese. Nondairy creamers. Whipped toppings.  Processed cheese and cheese spreads. Fats and oils Butter. Stick margarine. Lard. Shortening. Ghee. Bacon fat. Tropical oils, such as coconut, palm kernel, or palm oil. Seasoning and other foods Salted popcorn and pretzels. Onion salt, garlic salt, seasoned salt, table salt, and sea salt. Worcestershire sauce. Tartar sauce. Barbecue sauce. Teriyaki sauce. Soy sauce, including reduced-sodium. Steak sauce. Canned and packaged gravies. Fish sauce. Oyster sauce. Cocktail sauce. Horseradish that you find on the shelf. Ketchup. Mustard. Meat flavorings and tenderizers. Bouillon cubes. Hot sauce and Tabasco sauce. Premade or packaged marinades. Premade or packaged taco seasonings. Relishes. Regular salad dressings. Where to find more information:  National Heart, Lung, and Blood Institute: www.nhlbi.nih.gov  American Heart Association: www.heart.org Summary  The DASH eating plan is a healthy eating plan that has been shown to reduce high blood pressure (hypertension). It may also reduce your risk for type 2 diabetes, heart disease, and stroke.  With the DASH eating plan, you should limit salt (sodium) intake to 2,300 mg a day. If you have hypertension, you may need to reduce your sodium intake to 1,500 mg a day.  When on the DASH eating plan, aim to eat more fresh fruits and vegetables, whole grains, lean proteins, low-fat dairy, and heart-healthy fats.  Work with your health care provider or diet and nutrition specialist (dietitian) to adjust your eating plan to your individual   calorie needs. This information is not intended to replace advice given to you by your health care provider. Make sure you discuss any questions you have with your health care provider. Document Released: 07/26/2011 Document Revised: 07/30/2016 Document Reviewed: 07/30/2016 Elsevier Interactive Patient Education  2018 [employer]  Hypertension Hypertension, commonly called high blood pressure, is when the force of blood  pumping through the arteries is too strong. The arteries are the blood vessels that carry blood from the heart throughout the body. Hypertension forces the heart to work harder to pump blood and may cause arteries to become narrow or stiff. Having untreated or uncontrolled hypertension can cause heart attacks, strokes, kidney disease, and other problems. A blood pressure reading consists of a higher number over a lower number. Ideally, your blood pressure should be below 120/80. The first ("top") number is called the systolic pressure. It is a measure of the pressure in your arteries as your heart beats. The second ("bottom") number is called the diastolic pressure. It is a measure of the pressure in your arteries as the heart relaxes. What are the causes? The cause of this condition is not known. What increases the risk? Some risk factors for high blood pressure are under your control. Others are not. Factors you can change  Smoking.  Having type 2 diabetes mellitus, high cholesterol, or both.  Not getting enough exercise or physical activity.  Being overweight.  Having too much fat, sugar, calories, or salt (sodium) in your diet.  Drinking too much alcohol. Factors that are difficult or impossible to change  Having chronic kidney disease.  Having a family history of high blood pressure.  Age. Risk increases with age.  Race. You may be at higher risk if you are African-American.  Gender. Men are at higher risk than women before age 45. After age 65, women are at higher risk than men.  Having obstructive sleep apnea.  Stress. What are the signs or symptoms? Extremely high blood pressure (hypertensive crisis) may cause:  Headache.  Anxiety.  Shortness of breath.  Nosebleed.  Nausea and vomiting.  Severe chest pain.  Jerky movements you cannot control (seizures).  How is this diagnosed? This condition is diagnosed by measuring your blood pressure while you are  seated, with your arm resting on a surface. The cuff of the blood pressure monitor will be placed directly against the skin of your upper arm at the level of your heart. It should be measured at least twice using the same arm. Certain conditions can cause a difference in blood pressure between your right and left arms. Certain factors can cause blood pressure readings to be lower or higher than normal (elevated) for a short period of time:  When your blood pressure is higher when you are in a health care provider's office than when you are at home, this is called white coat hypertension. Most people with this condition do not need medicines.  When your blood pressure is higher at home than when you are in a health care provider's office, this is called masked hypertension. Most people with this condition may need medicines to control blood pressure.  If you have a high blood pressure reading during one visit or you have normal blood pressure with other risk factors:  You may be asked to return on a different day to have your blood pressure checked again.  You may be asked to monitor your blood pressure at home for 1 week or longer.  If you are   diagnosed with hypertension, you may have other blood or imaging tests to help your health care provider understand your overall risk for other conditions. How is this treated? This condition is treated by making healthy lifestyle changes, such as eating healthy foods, exercising more, and reducing your alcohol intake. Your health care provider may prescribe medicine if lifestyle changes are not enough to get your blood pressure under control, and if:  Your systolic blood pressure is above 130.  Your diastolic blood pressure is above 80.  Your personal target blood pressure may vary depending on your medical conditions, your age, and other factors. Follow these instructions at home: Eating and drinking  Eat a diet that is high in fiber and potassium,  and low in sodium, added sugar, and fat. An example eating plan is called the DASH (Dietary Approaches to Stop Hypertension) diet. To eat this way: ? Eat plenty of fresh fruits and vegetables. Try to fill half of your plate at each meal with fruits and vegetables. ? Eat whole grains, such as whole wheat pasta, brown rice, or whole grain bread. Fill about one quarter of your plate with whole grains. ? Eat or drink low-fat dairy products, such as skim milk or low-fat yogurt. ? Avoid fatty cuts of meat, processed or cured meats, and poultry with skin. Fill about one quarter of your plate with lean proteins, such as fish, chicken without skin, beans, eggs, and tofu. ? Avoid premade and processed foods. These tend to be higher in sodium, added sugar, and fat.  Reduce your daily sodium intake. Most people with hypertension should eat less than 1,500 mg of sodium a day.  Limit alcohol intake to no more than 1 drink a day for nonpregnant women and 2 drinks a day for men. One drink equals 12 oz of beer, 5 oz of wine, or 1 oz of hard liquor. Lifestyle  Work with your health care provider to maintain a healthy body weight or to lose weight. Ask what an ideal weight is for you.  Get at least 30 minutes of exercise that causes your heart to beat faster (aerobic exercise) most days of the week. Activities may include walking, swimming, or biking.  Include exercise to strengthen your muscles (resistance exercise), such as pilates or lifting weights, as part of your weekly exercise routine. Try to do these types of exercises for 30 minutes at least 3 days a week.  Do not use any products that contain nicotine or tobacco, such as cigarettes and e-cigarettes. If you need help quitting, ask your health care provider.  Monitor your blood pressure at home as told by your health care provider.  Keep all follow-up visits as told by your health care provider. This is important. Medicines  Take over-the-counter and  prescription medicines only as told by your health care provider. Follow directions carefully. Blood pressure medicines must be taken as prescribed.  Do not skip doses of blood pressure medicine. Doing this puts you at risk for problems and can make the medicine less effective.  Ask your health care provider about side effects or reactions to medicines that you should watch for. Contact a health care provider if:  You think you are having a reaction to a medicine you are taking.  You have headaches that keep coming back (recurring).  You feel dizzy.  You have swelling in your ankles.  You have trouble with your vision. Get help right away if:  You develop a severe headache or confusion.    You have unusual weakness or numbness.  You feel faint.  You have severe pain in your chest or abdomen.  You vomit repeatedly.  You have trouble breathing. Summary  Hypertension is when the force of blood pumping through your arteries is too strong. If this condition is not controlled, it may put you at risk for serious complications.  Your personal target blood pressure may vary depending on your medical conditions, your age, and other factors. For most people, a normal blood pressure is less than 120/80.  Hypertension is treated with lifestyle changes, medicines, or a combination of both. Lifestyle changes include weight loss, eating a healthy, low-sodium diet, exercising more, and limiting alcohol. This information is not intended to replace advice given to you by your health care provider. Make sure you discuss any questions you have with your health care provider. Document Released: 08/06/2005 Document Revised: 07/04/2016 Document Reviewed: 07/04/2016 Elsevier Interactive Patient Education  2018 [employer].  Osteoarthritis Osteoarthritis is a type of arthritis that affects tissue that covers the ends of bones in joints (cartilage). Cartilage acts as a cushion between the bones and  helps them move smoothly. Osteoarthritis results when cartilage in the joints gets worn down. Osteoarthritis is sometimes called "wear and tear" arthritis. Osteoarthritis is the most common form of arthritis. It often occurs in older people. It is a condition that gets worse over time (a progressive condition). Joints that are most often affected by this condition are in:  Fingers.  Toes.  Hips.  Knees.  Spine, including neck and lower back.  What are the causes? This condition is caused by age-related wearing down of cartilage that covers the ends of bones. What increases the risk? The following factors may make you more likely to develop this condition:  Older age.  Being overweight or obese.  Overuse of joints, such as in athletes.  Past injury of a joint.  Past surgery on a joint.  Family history of osteoarthritis.  What are the signs or symptoms? The main symptoms of this condition are pain, swelling, and stiffness in the joint. The joint may lose its shape over time. Small pieces of bone or cartilage may break off and float inside of the joint, which may cause more pain and damage to the joint. Small deposits of bone (osteophytes) may grow on the edges of the joint. Other symptoms may include:  A grating or scraping feeling inside the joint when you move it.  Popping or creaking sounds when you move.  Symptoms may affect one or more joints. Osteoarthritis in a major joint, such as your knee or hip, can make it painful to walk or exercise. If you have osteoarthritis in your hands, you might not be able to grip items, twist your hand, or control small movements of your hands and fingers (fine motor skills). How is this diagnosed? This condition may be diagnosed based on:  Your medical history.  A physical exam.  Your symptoms.  X-rays of the affected joint(s).  Blood tests to rule out other types of arthritis.  How is this treated? There is no cure for this  condition, but treatment can help to control pain and improve joint function. Treatment plans may include:  A prescribed exercise program that allows for rest and joint relief. You may work with a physical therapist.  A weight control plan.  Pain relief techniques, such as: ? Applying heat and cold to the joint. ? Electric pulses delivered to nerve endings under the skin (  transcutaneous electrical nerve stimulation, or TENS). ? Massage. ? Certain nutritional supplements.  NSAIDs or prescription medicines to help relieve pain.  Medicine to help relieve pain and inflammation (corticosteroids). This can be given by mouth (orally) or as an injection.  Assistive devices, such as a brace, wrap, splint, specialized glove, or cane.  Surgery, such as: ? An osteotomy. This is done to reposition the bones and relieve pain or to remove loose pieces of bone and cartilage. ? Joint replacement surgery. You may need this surgery if you have very bad (advanced) osteoarthritis.  Follow these instructions at home: Activity  Rest your affected joints as directed by your health care provider.  Do not drive or use heavy machinery while taking prescription pain medicine.  Exercise as directed. Your health care provider or physical therapist may recommend specific types of exercise, such as: ? Strengthening exercises. These are done to strengthen the muscles that support joints that are affected by arthritis. They can be performed with weights or with exercise bands to add resistance. ? Aerobic activities. These are exercises, such as brisk walking or water aerobics, that get your heart pumping. ? Range-of-motion activities. These keep your joints easy to move. ? Balance and agility exercises. Managing pain, stiffness, and swelling  If directed, apply heat to the affected area as often as told by your health care provider. Use the heat source that your health care provider recommends, such as a moist  heat pack or a heating pad. ? If you have a removable assistive device, remove it as told by your health care provider. ? Place a towel between your skin and the heat source. If your health care provider tells you to keep the assistive device on while you apply heat, place a towel between the assistive device and the heat source. ? Leave the heat on for 20-30 minutes. ? Remove the heat if your skin turns bright red. This is especially important if you are unable to feel pain, heat, or cold. You may have a greater risk of getting burned.  If directed, put ice on the affected joint: ? If you have a removable assistive device, remove it as told by your health care provider. ? Put ice in a plastic bag. ? Place a towel between your skin and the bag. If your health care provider tells you to keep the assistive device on during icing, place a towel between the assistive device and the bag. ? Leave the ice on for 20 minutes, 2-3 times a day. General instructions  Take over-the-counter and prescription medicines only as told by your health care provider.  Maintain a healthy weight. Follow instructions from your health care provider for weight control. These may include dietary restrictions.  Do not use any products that contain nicotine or tobacco, such as cigarettes and e-cigarettes. These can delay bone healing. If you need help quitting, ask your health care provider.  Use assistive devices as directed by your health care provider.  Keep all follow-up visits as told by your health care provider. This is important. Where to find more information:  [employer] of Arthritis and Musculoskeletal and Skin Diseases: www.niams.http://www.myers.net/  [employer] on Aging: https://walker.com/  American College of Rheumatology: www.rheumatology.org Contact a health care provider if:  Your skin turns red.  You develop a rash.  You have pain that gets worse.  You have a fever along with joint or muscle  aches. Get help right away if:  You lose a lot of weight.  You suddenly lose your appetite.  You have night sweats. Summary  Osteoarthritis is a type of arthritis that affects tissue covering the ends of bones in joints (cartilage).  This condition is caused by age-related wearing down of cartilage that covers the ends of bones.  The main symptom of this condition is pain, swelling, and stiffness in the joint.  There is no cure for this condition, but treatment can help to control pain and improve joint function. This information is not intended to replace advice given to you by your health care provider. Make sure you discuss any questions you have with your health care provider. Document Released: 08/06/2005 Document Revised: 04/09/2016 Document Reviewed: 04/09/2016 Elsevier Interactive Patient Education  2018 [employer]. Testosterone skin patches What is this medicine? TESTOSTERONE (tes TOS ter one) is the main female hormone. It supports normal female traits such as muscle growth, facial hair, and deep voice. This skin patch is used in males to treat low testosterone levels. This medicine may be used for other purposes; ask your health care provider or pharmacist if you have questions. COMMON BRAND NAME(S): Androderm, Testoderm What should I tell my health care provider before I take this medicine? They need to know if you have any of these conditions: -breast cancer -diabetes -heart disease -kidney disease -liver disease -lung disease -prostate cancer, enlargement -an unusual or allergic reaction to testosterone, adhesives, other medicines, foods, dyes, or preservatives -pregnant or trying to get pregnant -breast-feeding How should I use this medicine? Apply these patches once daily, at the same time every evening. Follow the directions on the prescription label. Open the pouch and remove the patch. Remove the protective liner and silver disk from the patch. If the liner is  difficult to pull off or if you see glue sticking to the liner, do NOT use the patch, throw it away and get a new one. Apply to a clean, dry area of intact skin on the back, abdomen, upper arms, or thighs. Do not apply to a bony area like the hip or shoulder or an area that might receive a lot of pressure while sitting or sleeping. Do not apply to the genitals or scrotum. Remove and replace the patch as directed every 24 hours, applying a new patch to a new site. When you remove a patch, do not place another patch on the same spot for at least 7 days. The patch may be worn during sex, showering, or swimming. Excessive sweating or strenuous exercise might cause the patch to loosen or fall off. Talk to your pediatrician regarding the use of this medicine in children. While this skin patch has been used in males as young as 56 years of age, precautions may apply. Overdosage: If you think you have taken too much of this medicine contact a poison control center or emergency room at once. NOTE: This medicine is only for you. Do not share this medicine with others. What if I miss a dose? Try not to miss a dose. If the patch becomes loose, simply smooth it down again around the edges. If a patch is forgotten or falls off before noon, apply a fresh patch and wear it until you get back on your normal schedule that evening. If a patch is forgotten or falls off in the afternoon or later in the day, do not use a new patch until it is time for your next evening dose. Do not use double or extra doses. What may interact with this medicine? -medicines  for diabetes -medicines that treat or prevent blood clots like warfarin -oxyphenbutazone -propranolol -steroid medicines like prednisone or cortisone This list may not describe all possible interactions. Give your health care provider a list of all the medicines, herbs, non-prescription drugs, or dietary supplements you use. Also tell them if you smoke, drink alcohol, or  use illegal drugs. Some items may interact with your medicine. What should I watch for while using this medicine? Visit your doctor or health care professional for regular checks on your progress. They will need to check the level of testosterone in your blood. This medicine is only approved for use in men who have low levels of testosterone related to certain medical conditions. Heart attacks and strokes have been reported with the use of this medicine. Notify your doctor or health care professional and seek emergency treatment if you develop breathing problems; changes in vision; confusion; chest pain or chest tightness; sudden arm pain; severe, sudden headache; trouble speaking or understanding; sudden numbness or weakness of the face, arm or leg; loss of balance or coordination. Talk to your doctor about the risks and benefits of this medicine. This medicine may affect blood sugar levels. If you have diabetes, check with your doctor or health care professional before you change your diet or the dose of your diabetic medicine. This drug is banned from use in athletes by most athletic organizations. If you are going to have a magnetic resonance imaging (MRI) procedure, tell your MRI technician if you have this patch on your body. It must be removed before a MRI. What side effects may I notice from receiving this medicine? Side effects that you should report to your doctor or health care professional as soon as possible: -allergic reactions like skin rash, itching or hives, swelling of the face, lips, or tongue -breast enlargement -breathing problems -changes in mood, especially anger, depression, or rage -dark urine -general ill feeling or flu-like symptoms -light-colored stools -loss of appetite, nausea -nausea, vomiting -right upper belly pain -stomach pain -swelling of ankles -too frequent or persistent erections -trouble passing urine or change in the amount of urine -unusually weak or  tired -yellowing of the eyes or skin Side effects that usually do not require medical attention (report to your doctor or health care professional if they continue or are bothersome): -acne -change in sex drive or performance -hair loss -headache -mild redness of the skin under the patch This list may not describe all possible side effects. Call your doctor for medical advice about side effects. You may report side effects to FDA at 1-800-FDA-1088. Where should I keep my medicine? Keep out of the reach of children. This medicine can be abused. Keep your medicine in a safe place to protect it from theft. Do not share this medicine with anyone. Selling or giving away this medicine is dangerous and against the law. Store at room temperature between 15 and 30 degrees C (59 and 86 degrees F). Keep each patch in its sealed pouch until ready to use. Protect from heat. Do not use a patch that appears to be damaged. Throw away any unused medicine after the expiration date. NOTE: This sheet is a summary. It may not cover all possible information. If you have questions about this medicine, talk to your doctor, pharmacist, or health care provider.  2018 Elsevier/Gold Standard (2015-09-23 10:47:03)

## 2017-08-28 NOTE — Progress Notes (Signed)
Assessment & Plan:  Veronica Turner was seen today for establish care.  Diagnoses and all orders for this visit:  Essential hypertension -     Basic metabolic panel -     CBC -     Ambulatory referral to Cardiology -     cloNIDine (CATAPRES) tablet 0.2 mg -     amLODipine (NORVASC) 5 MG tablet; Take 1 tablet (5 mg total) by mouth daily. -     isosorbide mononitrate (IMDUR) 60 MG 24 hr tablet; Take 1 tablet (60 mg total) by mouth daily. -     carvedilol (COREG) 12.5 MG tablet; Take 1 tablet (12.5 mg total) by mouth 2 (two) times daily with a meal. Continue all antihypertensives as prescribed.  Remember to bring in your blood pressure log with you for your follow up appointment.  DASH/Mediterranean Diets are healthier choices for HTN.    Obesity (BMI 30.0-39.9) with comorbidity (HCC) Discussed diet and exercise for person with BMI >25. Instructed: You must burn more calories than you eat. Losing 5 percent of your body weight should be considered a success. In the longer term, losing more than 15 percent of your body weight and staying at this weight is an extremely good result. However, keep in mind that even losing 5 percent of your body weight leads to important health benefits, so try not to get discouraged if you're not able to lose more than this. Will recheck weight in 3-6 months.  Breast cancer screening by mammogram -     MM DIGITAL SCREENING BILATERAL; Future  Colon cancer screening -     Ambulatory referral to Gastroenterology  Other orders -     senna-docusate (SENOKOT-S) 8.6-50 MG tablet; Take 1 tablet by mouth at bedtime as needed for mild constipation.    Patient has been counseled on age-appropriate routine health concerns for screening and prevention. These are reviewed and up-to-date. Referrals have been placed accordingly. Immunizations are up-to-date or declined.    Subjective:   Chief Complaint  Patient presents with  . Establish Care    Patient is here to  establish care. Patient stated both of her shoulder are in pain but her right shoulder is in most pain. Patient stated she have stiffnes in her wrist,knees, and ankles. Patient stated she get shortness of breath, heart beat fast when carry heavy load.    HPI Veronica Turner 57 y.o. female presents to office today to establish care as a new patient.  Essential Hypertension She ran out of her blood pressure medication 2 days ago. Blood pressure is poorly controlled today. She is not diet compliant. She is requesting a cardiology consult. Reports a feeling of heart racing with increased activity. She denies any chest pain or lightheadedness with these episodes.  I explained to Ms. Veronica Turner this may likely be due to her sedentary lifestyle and that she needs to start exercising more often to avoid activity intolerance. She would still like to be evaluated by cardiology. Denies  palpitations, dizziness, headaches or BLE edema.  BP Readings from Last 3 Encounters:  08/28/17 (!) 179/109  05/06/17 (!) 140/99  05/01/17 118/75   OA She endorses bilateral shoulder pain >R, wrist, knees and ankles. I encouraged her to join a water aerobics class, start walking daily, to lose weight and stop smoking to help reduce the progression of OA.   Review of Systems  Constitutional: Negative for fever, malaise/fatigue and weight loss.  HENT: Negative.  Negative for nosebleeds.   Eyes: Negative.  Negative for blurred vision, double vision and photophobia.  Respiratory: Positive for shortness of breath. Negative for cough.   Cardiovascular: Positive for palpitations. Negative for chest pain and leg swelling.  Gastrointestinal: Negative.  Negative for abdominal pain, constipation, diarrhea, heartburn, nausea and vomiting.  Musculoskeletal: Positive for back pain and joint pain. Negative for myalgias.  Neurological: Negative.  Negative for dizziness, focal weakness, seizures and headaches.  Endo/Heme/Allergies: Positive  for environmental allergies.  Psychiatric/Behavioral: Negative.  Negative for suicidal ideas.    Past Medical History:  Diagnosis Date  . TIA (transient ischemic attack) 04/20/12  . Tobacco abuse     Past Surgical History:  Procedure Laterality Date  . CESAREAN SECTION    . cyst on thyroid Bilateral    As infant    Family History  Problem Relation Age of Onset  . Heart disease Mother   . Hypertension Mother   . Cancer Father     Social History Reviewed with no changes to be made today.   Outpatient Medications Prior to Visit  Medication Sig Dispense Refill  . albuterol (PROVENTIL HFA;VENTOLIN HFA) 108 (90 Base) MCG/ACT inhaler Inhale 2 puffs into the lungs every 6 (six) hours as needed for wheezing or shortness of breath. 1 Inhaler 2  . aspirin 81 MG tablet Take 1 tablet (81 mg total) by mouth daily. 30 tablet 3  . fluticasone (FLONASE) 50 MCG/ACT nasal spray Place 2 sprays into both nostrils daily. 1 g 0  . amLODipine (NORVASC) 5 MG tablet Take 1 tablet (5 mg total) by mouth daily. 30 tablet 1  . azithromycin (ZITHROMAX) 500 MG tablet Take 1 tablet (500 mg total) by mouth daily. X 8 more days 8 tablet 0  . benzonatate (TESSALON) 100 MG capsule Take 1 capsule (100 mg total) by mouth 3 (three) times daily as needed for cough. 30 capsule 0  . carvedilol (COREG) 12.5 MG tablet Take 1 tablet (12.5 mg total) by mouth 2 (two) times daily with a meal. 60 tablet 1  . isosorbide mononitrate (IMDUR) 60 MG 24 hr tablet Take 1 tablet (60 mg total) by mouth daily. 30 tablet 1  . senna-docusate (SENOKOT-S) 8.6-50 MG tablet Take 1 tablet by mouth at bedtime as needed for mild constipation. 30 tablet 0  . guaiFENesin (MUCINEX) 600 MG 12 hr tablet Take 2 tablets (1,200 mg total) by mouth 2 (two) times daily. (Patient not taking: Reported on 08/28/2017) 60 tablet 0  . guaiFENesin-dextromethorphan (ROBITUSSIN DM) 100-10 MG/5ML syrup Take 5 mLs by mouth every 4 (four) hours as needed for cough.  (Patient not taking: Reported on 08/28/2017) 118 mL 0   No facility-administered medications prior to visit.     Allergies  Allergen Reactions  . Penicillins Anaphylaxis    Throat closes  Has patient had a PCN reaction causing immediate rash, facial/tongue/throat swelling, SOB or lightheadedness with hypotension: Yes Has patient had a PCN reaction causing severe rash involving mucus membranes or skin necrosis: Unknown Has patient had a PCN reaction that required hospitalization: Yes Has patient had a PCN reaction occurring within the last 10 years: No If all of the above answers are "NO", then may proceed with Cephalosporin use.        Objective:    BP (!) 179/109 (BP Location: Left Arm, Cuff Size: Normal)   Pulse 74   Temp 97.9 F (36.6 C) (Oral)   Resp 12   Ht 5\' 4"  (1.626 m)   Wt 193 lb 9.6 oz (87.8 kg)  LMP 07/01/2014   SpO2 96%   BMI 33.23 kg/m  Wt Readings from Last 3 Encounters:  08/28/17 193 lb 9.6 oz (87.8 kg)  05/02/17 160 lb (72.6 kg)  05/01/17 150 lb (68 kg)    Physical Exam  Constitutional: She is oriented to person, place, and time. She appears well-developed and well-nourished. She is cooperative.  HENT:  Head: Normocephalic and atraumatic.  Eyes: EOM are normal.  Neck: Normal range of motion.  Cardiovascular: Normal rate, regular rhythm and intact distal pulses. Exam reveals no gallop and no friction rub.  No murmur heard. Pulmonary/Chest: Effort normal and breath sounds normal. No tachypnea. No respiratory distress. She has no decreased breath sounds. She has no wheezes. She has no rhonchi. She has no rales. She exhibits no tenderness.  Abdominal: Soft. Bowel sounds are normal.  Musculoskeletal: Normal range of motion. She exhibits no edema.  Neurological: She is alert and oriented to person, place, and time. Coordination normal.  Skin: Skin is warm and dry. No rash noted. No erythema. No pallor.  Psychiatric: She has a normal mood and affect. Her  behavior is normal. Judgment and thought content normal.  Nursing note and vitals reviewed.      Patient has been counseled extensively about nutrition and exercise as well as the importance of adherence with medications and regular follow-up. The patient was given clear instructions to go to ER or return to medical center if symptoms don't improve, worsen or new problems develop. The patient verbalized understanding.   Follow-up: Return in about 1 month (around 09/28/2017) for PAP SMEAR and physical.   Claiborne Rigg, FNP-BC [facility] and [facility] [place], [place] [phone]   08/28/2017, 11:16 AM

## 2017-08-29 LAB — CBC
Hematocrit: 43.9 % (ref 34.0–46.6)
Hemoglobin: 14.7 g/dL (ref 11.1–15.9)
MCH: 30.9 pg (ref 26.6–33.0)
MCHC: 33.5 g/dL (ref 31.5–35.7)
MCV: 92 fL (ref 79–97)
PLATELETS: 243 10*3/uL (ref 150–379)
RBC: 4.75 x10E6/uL (ref 3.77–5.28)
RDW: 13.3 % (ref 12.3–15.4)
WBC: 3.5 10*3/uL (ref 3.4–10.8)

## 2017-08-29 LAB — BASIC METABOLIC PANEL
BUN / CREAT RATIO: 20 (ref 9–23)
BUN: 17 mg/dL (ref 6–24)
CALCIUM: 10 mg/dL (ref 8.7–10.2)
CO2: 26 mmol/L (ref 20–29)
Chloride: 103 mmol/L (ref 96–106)
Creatinine, Ser: 0.84 mg/dL (ref 0.57–1.00)
GFR, EST AFRICAN AMERICAN: 90 mL/min/{1.73_m2} (ref >59)
GFR, EST NON AFRICAN AMERICAN: 78 mL/min/{1.73_m2} (ref >59)
Glucose: 93 mg/dL (ref 65–99)
POTASSIUM: 4 mmol/L (ref 3.5–5.2)
Sodium: 145 mmol/L — ABNORMAL HIGH (ref 134–144)

## 2017-09-01 ENCOUNTER — Other Ambulatory Visit: Payer: Self-pay | Admitting: Nurse Practitioner

## 2017-09-01 DIAGNOSIS — R6882 Decreased libido: Secondary | ICD-10-CM

## 2017-09-01 NOTE — Progress Notes (Signed)
6 

## 2017-09-02 ENCOUNTER — Telehealth: Payer: Self-pay

## 2017-09-02 NOTE — Telephone Encounter (Signed)
-----   Message from Claiborne RiggZelda W Fleming, NP sent at 09/01/2017 11:35 PM EST ----- Regarding: Testosterone Please have [name] schedule an 830 lab appointment to have her testosterone level checked as a baseline prior to starting any hormone therapy.

## 2017-09-02 NOTE — Telephone Encounter (Signed)
Patient returned phone called and made a lab appointment on Tuesday 09/03/2017 at 8:45 am.

## 2017-09-02 NOTE — Telephone Encounter (Signed)
-----   Message from Zelda W Fleming, NP sent at 09/01/2017 11:35 PM EST ----- Regarding: Testosterone Please have Ms. Hohman schedule an 830 lab appointment to have her testosterone level checked as a baseline prior to starting any hormone therapy. 

## 2017-09-02 NOTE — Telephone Encounter (Signed)
CMA tried to reach patient, patient did not answer and left a VM for patient to call back.   CMA will attempt to call patient again.

## 2017-09-03 ENCOUNTER — Ambulatory Visit: Payer: BLUE CROSS/BLUE SHIELD | Attending: Nurse Practitioner

## 2017-09-03 DIAGNOSIS — R6882 Decreased libido: Secondary | ICD-10-CM | POA: Insufficient documentation

## 2017-09-03 NOTE — Progress Notes (Signed)
Patient here for lab visit only 

## 2017-09-04 LAB — TESTOSTERONE, FREE: TESTOSTERONE FREE: 1.4 pg/mL (ref 0.0–4.2)

## 2017-09-20 ENCOUNTER — Encounter: Payer: Self-pay | Admitting: Nurse Practitioner

## 2017-09-20 ENCOUNTER — Ambulatory Visit: Payer: BLUE CROSS/BLUE SHIELD | Attending: Nurse Practitioner | Admitting: Nurse Practitioner

## 2017-09-20 VITALS — BP 194/141 | HR 79 | Temp 98.1°F | Resp 12 | Ht 64.0 in | Wt 192.8 lb

## 2017-09-20 DIAGNOSIS — Z6833 Body mass index (BMI) 33.0-33.9, adult: Secondary | ICD-10-CM | POA: Insufficient documentation

## 2017-09-20 DIAGNOSIS — Z7982 Long term (current) use of aspirin: Secondary | ICD-10-CM | POA: Diagnosis not present

## 2017-09-20 DIAGNOSIS — E669 Obesity, unspecified: Secondary | ICD-10-CM

## 2017-09-20 DIAGNOSIS — Z79899 Other long term (current) drug therapy: Secondary | ICD-10-CM | POA: Diagnosis not present

## 2017-09-20 DIAGNOSIS — Z8673 Personal history of transient ischemic attack (TIA), and cerebral infarction without residual deficits: Secondary | ICD-10-CM | POA: Diagnosis not present

## 2017-09-20 DIAGNOSIS — Z0000 Encounter for general adult medical examination without abnormal findings: Secondary | ICD-10-CM | POA: Diagnosis not present

## 2017-09-20 DIAGNOSIS — Z880 Allergy status to penicillin: Secondary | ICD-10-CM | POA: Insufficient documentation

## 2017-09-20 DIAGNOSIS — I10 Essential (primary) hypertension: Secondary | ICD-10-CM | POA: Diagnosis not present

## 2017-09-20 DIAGNOSIS — M25531 Pain in right wrist: Secondary | ICD-10-CM

## 2017-09-20 DIAGNOSIS — F17210 Nicotine dependence, cigarettes, uncomplicated: Secondary | ICD-10-CM | POA: Diagnosis not present

## 2017-09-20 MED ORDER — CLONIDINE HCL 0.1 MG PO TABS
0.2000 mg | ORAL_TABLET | Freq: Once | ORAL | Status: AC
  Administered 2017-09-20: 0.2 mg via ORAL

## 2017-09-20 MED ORDER — DICLOFENAC SODIUM 1 % TD GEL: 4.0000 g | Freq: Four times a day (QID) | TRANSDERMAL | 1 refills | Status: AC

## 2017-09-20 MED ORDER — HYDROCHLOROTHIAZIDE 25 MG PO TABS: 25.0000 mg | ORAL_TABLET | Freq: Every day | ORAL | 3 refills | Status: AC

## 2017-09-20 MED FILL — HYDROCHLOROTHIAZIDE 25 MG T: 25 | 30 days supply | Qty: 30 | Fill #0

## 2017-09-20 MED FILL — DICLOFENAC SODIUM 1% GEL: 1 | 6 days supply | Qty: 100 | Fill #0

## 2017-09-20 NOTE — Progress Notes (Signed)
clo

## 2017-09-20 NOTE — Progress Notes (Signed)
Assessment & Plan:  Veronica Turner was seen today for annual exam and wrist pain.  Diagnoses and all orders for this visit:  Well woman exam (no gynecological exam)  Obesity (BMI 30.0-34.9) -     Thyroid Panel With TSH Discussed diet and exercise for person with BMI >25. Instructed: You must burn more calories than you eat. Losing 5 percent of your body weight should be considered a success. In the longer term, losing more than 15 percent of your body weight and staying at this weight is an extremely good result. However, keep in mind that even losing 5 percent of your body weight leads to important health benefits, so try not to get discouraged if you're not able to lose more than this. Will recheck weight in 3-6 months.   Essential hypertension -     cloNIDine (CATAPRES) tablet 0.2 mg -     Brain natriuretic peptide -     hydrochlorothiazide (HYDRODIURIL) 25 MG tablet; Take 1 tablet (25 mg total) by mouth daily. -     Thyroid Panel With TSH Continue all antihypertensives as prescribed.  Remember to bring in your blood pressure log with you for your follow up appointment.  DASH/Mediterranean Diets are healthier choices for HTN.    Right wrist pain -     diclofenac sodium (VOLTAREN) 1 % GEL; Apply 4 g topically 4 (four) times daily. May apply heat to affected area as needed for pain relief May try XS Tylenol or Tylenol arthritis for pain  Severe obesity (BMI 35.0-39.9) with comorbidity (HCC) BMI <35 at this time  Patient has been counseled on age-appropriate routine health concerns for screening and prevention. These are reviewed and up-to-date. Referrals have been placed accordingly. Immunizations are up-to-date or declined.    Subjective:   Chief Complaint  Patient presents with  . Annual Exam    Patient is here for a physical.  . Wrist Pain    Patient stated she is having a throbbing pain on her right wrist. Patient would like to talk about referrals for her wrist pain.     HPI Veronica Turner 57 y.o. female presents to office today for her physical. She states she did not take her blood pressure medication today but she took it yesterday. She has appointment with cardiology on 12-17-2017. She smokes 1-2 cigarettes per day.    Essential Hypertension Blood pressure is poorly controlled. She was given clonidine 0.2mg  in the office today. There was very little improvement in her blood pressure. Denies chest pain, shortness of breath, palpitations, lightheadedness, dizziness, headaches or BLE edema. She was advised to go to the emergency room after recheck of blood pressure. She declined and stated she was going home to take her blood pressure medication (she did miss today's dose). She also has not been taking her HCTZ. She is not sure why the medication was not called in for refill. Will refill today. She continues to smoke. She does take NSAIDs for arthritic pain. I instructed her to continue taking Acetaminophen and discontinue NSAIDs if possible.  BP Readings from Last 3 Encounters:  09/20/17 (!) 194/141  08/28/17 (!) 179/109  05/06/17 (!) 140/99   Wrist Pain Patient complaints of right wrist pain. This is evaluated as  no trauma or  injury. The pain began several weeks ago. The pain is located primarily in the dorsal area, radial area, ulnar area.  She describes the symptoms as aching, sharp, shooting and stabbing. Symptoms improve with heat, medication: XS Tylenol (little  relief of pain). The symptoms are worse with Nothing. The patient  does not have neck pain. The patient is active in none. Treatment to date has been heat, Tylenol, NSAID's, without significant relief.  Review of Systems  Constitutional: Negative.  Negative for chills, fever, malaise/fatigue and weight loss.  HENT: Negative.  Negative for congestion, hearing loss, sinus pain and sore throat.   Eyes: Negative.  Negative for blurred vision, double vision, photophobia and pain.  Respiratory:  Positive for cough. Negative for sputum production, shortness of breath and wheezing.   Cardiovascular: Negative.  Negative for chest pain and leg swelling.  Gastrointestinal: Negative.  Negative for abdominal pain, constipation, diarrhea, heartburn, nausea and vomiting.  Genitourinary: Negative.   Musculoskeletal: Positive for joint pain. Negative for myalgias.       SEE HPI  Skin: Negative.  Negative for rash.  Neurological: Negative.  Negative for dizziness, tremors, speech change, focal weakness, seizures and headaches.  Endo/Heme/Allergies: Negative.  Negative for environmental allergies.  Psychiatric/Behavioral: Negative for depression and suicidal ideas. The patient is not nervous/anxious and does not have insomnia.        Increased irritability    Past Medical History:  Diagnosis Date  . TIA (transient ischemic attack) 04/20/12  . Tobacco abuse     Past Surgical History:  Procedure Laterality Date  . CESAREAN SECTION    . cyst on thyroid Bilateral    As infant    Family History  Problem Relation Age of Onset  . Heart disease Mother   . Hypertension Mother   . Cancer Father     Social History Reviewed with no changes to be made today.   Outpatient Medications Prior to Visit  Medication Sig Dispense Refill  . albuterol (PROVENTIL HFA;VENTOLIN HFA) 108 (90 Base) MCG/ACT inhaler Inhale 2 puffs into the lungs every 6 (six) hours as needed for wheezing or shortness of breath. 1 Inhaler 2  . amLODipine (NORVASC) 5 MG tablet Take 1 tablet (5 mg total) by mouth daily. 30 tablet 3  . aspirin 81 MG tablet Take 1 tablet (81 mg total) by mouth daily. 30 tablet 3  . carvedilol (COREG) 12.5 MG tablet Take 1 tablet (12.5 mg total) by mouth 2 (two) times daily with a meal. 60 tablet 3  . fluticasone (FLONASE) 50 MCG/ACT nasal spray Place 2 sprays into both nostrils daily. 1 g 0  . isosorbide mononitrate (IMDUR) 60 MG 24 hr tablet Take 1 tablet (60 mg total) by mouth daily. 30 tablet 1   . senna-docusate (SENOKOT-S) 8.6-50 MG tablet Take 1 tablet by mouth at bedtime as needed for mild constipation. 30 tablet 3   No facility-administered medications prior to visit.     Allergies  Allergen Reactions  . Penicillins Anaphylaxis    Throat closes  Has patient had a PCN reaction causing immediate rash, facial/tongue/throat swelling, SOB or lightheadedness with hypotension: Yes Has patient had a PCN reaction causing severe rash involving mucus membranes or skin necrosis: Unknown Has patient had a PCN reaction that required hospitalization: Yes Has patient had a PCN reaction occurring within the last 10 years: No If all of the above answers are "NO", then may proceed with Cephalosporin use.        Objective:    BP (!) 194/141 (BP Location: Right Arm, Cuff Size: Normal)   Pulse 79   Temp 98.1 F (36.7 C) (Oral)   Resp 12   Ht 5\' 4"  (1.626 m)   Wt 192 lb 12.8  oz (87.5 kg)   LMP 07/01/2014   SpO2 95%   BMI 33.09 kg/m  Wt Readings from Last 3 Encounters:  09/20/17 192 lb 12.8 oz (87.5 kg)  08/28/17 193 lb 9.6 oz (87.8 kg)  05/02/17 160 lb (72.6 kg)    Physical Exam  Constitutional: She is oriented to person, place, and time. She appears well-developed and well-nourished.  HENT:  Head: Normocephalic and atraumatic.  Right Ear: External ear normal.  Left Ear: External ear normal.  Nose: Nose normal.  Mouth/Throat: Oropharynx is clear and moist. Abnormal dentition. No oropharyngeal exudate.  edentulous  Eyes: Conjunctivae and EOM are normal. Pupils are equal, round, and reactive to light. Right eye exhibits no discharge. No scleral icterus.  Neck: Normal range of motion. Neck supple. No tracheal deviation present. No thyromegaly present.  Cardiovascular: Normal rate, regular rhythm, normal heart sounds and intact distal pulses. Exam reveals no friction rub.  No murmur heard. Pulmonary/Chest: Effort normal and breath sounds normal. No accessory muscle usage. No  respiratory distress. She has no decreased breath sounds. She has no wheezes. She has no rhonchi. She has no rales. She exhibits no tenderness. Right breast exhibits no inverted nipple, no mass, no nipple discharge, no skin change and no tenderness. Left breast exhibits no inverted nipple, no mass, no nipple discharge, no skin change and no tenderness. Breasts are asymmetrical (right breast <left breast).  Abdominal: Soft. Bowel sounds are normal. She exhibits no distension and no mass. There is no tenderness. There is no rebound and no guarding.  Musculoskeletal: Normal range of motion. She exhibits no edema, tenderness or deformity.       Right wrist: She exhibits normal range of motion, no tenderness, no bony tenderness, no swelling and no deformity.       Arms: Right wrist: Negative phalen test; negative tinnel test  Lymphadenopathy:    She has no cervical adenopathy.  Neurological: She is alert and oriented to person, place, and time. She has normal reflexes. No cranial nerve deficit. Coordination normal.  Skin: Skin is warm and dry. No erythema.  Psychiatric: She has a normal mood and affect. Her speech is normal and behavior is normal. Judgment and thought content normal.         Patient has been counseled extensively about nutrition and exercise as well as the importance of adherence with medications and regular follow-up. The patient was given clear instructions to go to ER or return to medical center if symptoms don't improve, worsen or new problems develop. The patient verbalized understanding.   Follow-up: Return in about 3 weeks (around 10/11/2017) for BP recheck.   Claiborne Rigg, FNP-BC [facility] and [facility] [place], [place] [phone]   09/20/2017, 10:12 AM

## 2017-09-20 NOTE — Patient Instructions (Signed)

## 2017-09-21 LAB — BRAIN NATRIURETIC PEPTIDE: BNP: 50.8 pg/mL (ref 0.0–100.0)

## 2017-09-21 LAB — THYROID PANEL WITH TSH
Free Thyroxine Index: 2.2 (ref 1.2–4.9)
T3 Uptake Ratio: 21 % — ABNORMAL LOW (ref 24–39)
T4, Total: 10.3 ug/dL (ref 4.5–12.0)
TSH: 1.29 u[IU]/mL (ref 0.450–4.500)

## 2017-12-09 NOTE — Progress Notes (Deleted)
HPI: FU hypertension. Nuclear study 7/15 showed EF 57 and no ischemia or infarction.  Echocardiogram September 2018 showed normal LV function, grade 1 diastolic dysfunction.  Since last seen,    Current Outpatient Medications  Medication Sig Dispense Refill  . albuterol (PROVENTIL HFA;VENTOLIN HFA) 108 (90 Base) MCG/ACT inhaler Inhale 2 puffs into the lungs every 6 (six) hours as needed for wheezing or shortness of breath. 1 Inhaler 2  . amLODipine (NORVASC) 5 MG tablet Take 1 tablet (5 mg total) by mouth daily. 30 tablet 3  . aspirin 81 MG tablet Take 1 tablet (81 mg total) by mouth daily. 30 tablet 3  . carvedilol (COREG) 12.5 MG tablet Take 1 tablet (12.5 mg total) by mouth 2 (two) times daily with a meal. 60 tablet 3  . diclofenac sodium (VOLTAREN) 1 % GEL Apply 4 g topically 4 (four) times daily. 100 g 1  . fluticasone (FLONASE) 50 MCG/ACT nasal spray Place 2 sprays into both nostrils daily. 1 g 0  . hydrochlorothiazide (HYDRODIURIL) 25 MG tablet Take 1 tablet (25 mg total) by mouth daily. 90 tablet 3  . isosorbide mononitrate (IMDUR) 60 MG 24 hr tablet Take 1 tablet (60 mg total) by mouth daily. 30 tablet 1  . senna-docusate (SENOKOT-S) 8.6-50 MG tablet Take 1 tablet by mouth at bedtime as needed for mild constipation. 30 tablet 3   No current facility-administered medications for this visit.      Past Medical History:  Diagnosis Date  . TIA (transient ischemic attack) 04/20/12  . Tobacco abuse     Past Surgical History:  Procedure Laterality Date  . CESAREAN SECTION    . cyst on thyroid Bilateral    As infant    Social History   Socioeconomic History  . Marital status: Married    Spouse name: Not on file  . Number of children: Not on file  . Years of education: Not on file  . Highest education level: Not on file  Occupational History  . Not on file  Social Needs  . Financial resource strain: Not on file  . Food insecurity:    Worry: Not on file   Inability: Not on file  . Transportation needs:    Medical: Not on file    Non-medical: Not on file  Tobacco Use  . Smoking status: Current Every Day Smoker    Packs/day: 0.15    Types: Cigarettes  . Smokeless tobacco: Never Used  . Tobacco comment: Smoking 2 cigs per day  Substance and Sexual Activity  . Alcohol use: No    Alcohol/week: 0.6 oz    Types: 1 Standard drinks or equivalent per week  . Drug use: No  . Sexual activity: Not on file  Lifestyle  . Physical activity:    Days per week: Not on file    Minutes per session: Not on file  . Stress: Not on file  Relationships  . Social connections:    Talks on phone: Not on file    Gets together: Not on file    Attends religious service: Not on file    Active member of club or organization: Not on file    Attends meetings of clubs or organizations: Not on file    Relationship status: Not on file  . Intimate partner violence:    Fear of current or ex partner: Not on file    Emotionally abused: Not on file    Physically abused: Not on file  Forced sexual activity: Not on file  Other Topics Concern  . Not on file  Social History Narrative  . Not on file    Family History  Problem Relation Age of Onset  . Heart disease Mother   . Hypertension Mother   . Cancer Father     ROS: no fevers or chills, productive cough, hemoptysis, dysphasia, odynophagia, melena, hematochezia, dysuria, hematuria, rash, seizure activity, orthopnea, PND, pedal edema, claudication. Remaining systems are negative.  Physical Exam: Well-developed well-nourished in no acute distress.  Skin is warm and dry.  HEENT is normal.  Neck is supple.  Chest is clear to auscultation with normal expansion.  Cardiovascular exam is regular rate and rhythm.  Abdominal exam nontender or distended. No masses palpated. Extremities show no edema. neuro grossly intact  ECG- personally reviewed  A/P  1  Olga Millers, MD

## 2017-12-17 ENCOUNTER — Ambulatory Visit: Payer: BLUE CROSS/BLUE SHIELD | Admitting: Cardiology

## 2018-02-26 ENCOUNTER — Other Ambulatory Visit: Payer: Self-pay

## 2018-02-26 ENCOUNTER — Ambulatory Visit (HOSPITAL_COMMUNITY)
Admission: EM | Admit: 2018-02-26 | Discharge: 2018-02-26 | Disposition: A | Discharge status: Home / Self Care | Payer: BLUE CROSS/BLUE SHIELD | Attending: Family Medicine | Admitting: Family Medicine

## 2018-02-26 ENCOUNTER — Encounter (HOSPITAL_COMMUNITY): Payer: Self-pay | Admitting: Emergency Medicine

## 2018-02-26 DIAGNOSIS — R040 Epistaxis: Secondary | ICD-10-CM

## 2018-02-26 DIAGNOSIS — I10 Essential (primary) hypertension: Secondary | ICD-10-CM

## 2018-02-26 MED ORDER — AMLODIPINE BESYLATE 5 MG PO TABS: 5.0000 mg | ORAL_TABLET | Freq: Every day | ORAL | 0 refills | Status: AC

## 2018-02-26 MED ORDER — ALBUTEROL SULFATE HFA 108 (90 BASE) MCG/ACT IN AERS: 2.0000 | INHALATION_SPRAY | Freq: Four times a day (QID) | RESPIRATORY_TRACT | 0 refills | Status: AC | PRN

## 2018-02-26 MED ORDER — ISOSORBIDE MONONITRATE ER 60 MG PO TB24: 60.0000 mg | ORAL_TABLET | Freq: Every day | ORAL | 0 refills | Status: AC

## 2018-02-26 MED ORDER — CARVEDILOL 12.5 MG PO TABS: 12.5000 mg | ORAL_TABLET | Freq: Two times a day (BID) | ORAL | 0 refills | Status: AC

## 2018-02-26 NOTE — ED Provider Notes (Signed)
[facility]   [id number] 02/26/18 Arrival Time: 1827  ASSESSMENT & PLAN:  1. Epistaxis   2. Uncontrolled hypertension   3. Essential hypertension     Meds ordered this encounter  Medications  . amLODipine (NORVASC) 5 MG tablet    Sig: Take 1 tablet (5 mg total) by mouth daily.    Dispense:  30 tablet    Refill:  0  . albuterol (PROVENTIL HFA;VENTOLIN HFA) 108 (90 Base) MCG/ACT inhaler    Sig: Inhale 2 puffs into the lungs every 6 (six) hours as needed for wheezing or shortness of breath.    Dispense:  1 Inhaler    Refill:  0  . carvedilol (COREG) 12.5 MG tablet    Sig: Take 1 tablet (12.5 mg total) by mouth 2 (two) times daily with a meal.    Dispense:  60 tablet    Refill:  0  . isosorbide mononitrate (IMDUR) 60 MG 24 hr tablet    Sig: Take 1 tablet (60 mg total) by mouth daily.    Dispense:  30 tablet    Refill:  0   Refilled above medications as requested. Nosebleed has been controlled with pressure to nose.  Has f/u with her PCP tomorrow.  Reviewed expectations re: course of current medical issues. Questions answered. Outlined signs and symptoms indicating need for more acute intervention. Patient verbalized understanding. After Visit Summary given.   SUBJECTIVE:  Veronica Turner is a 57 y.o. female who presents with concerns regarding increased blood pressures. She reports that she has been treated for hypertension in the past. Requests med refills.  She reports taking medications as instructed, no medication side effects noted and no swelling of ankles.  Also reports R-sided nosebleed a few times today. Usually stops with pressure. No injury.  Social History   Tobacco Use  Smoking Status Current Every Day Smoker  . Packs/day: 0.15  . Types: Cigarettes  Smokeless Tobacco Never Used  Tobacco Comment   Smoking 2 cigs per day    ROS: As per HPI.   OBJECTIVE:  Vitals:   02/26/18 1849  BP: (!) 191/101  Pulse: 86  Resp: 18  Temp: 98.2 F  (36.8 C)  TempSrc: Oral  SpO2: 99%    General appearance: alert; no distress Eyes: PERRLA; EOMI HENT: normocephalic; atraumatic; able to visualize small ooze in anterior R nare Neck: supple Lungs: clear to auscultation bilaterally Heart: regular rate and rhythm without murmer Extremities: edema; symmetrical with no gross deformities Skin: warm and dry Psychological: alert and cooperative; normal mood and affect  ECG: Orders placed or performed during the hospital encounter of 05/02/17  . EKG 12-Lead  . EKG 12-Lead  . EKG 12-Lead  . EKG 12-Lead  . EKG    Labs: Results for orders placed or performed in visit on 09/20/17  Brain natriuretic peptide  Result Value Ref Range   BNP 50.8 0.0 - 100.0 pg/mL  Thyroid Panel With TSH  Result Value Ref Range   TSH 1.290 0.450 - 4.500 uIU/mL   T4, Total 10.3 4.5 - 12.0 ug/dL   T3 Uptake Ratio 21 (L) 24 - 39 %   Free Thyroxine Index 2.2 1.2 - 4.9   Labs Reviewed - No data to display  Imaging: No results found.  Allergies  Allergen Reactions  . Penicillins Anaphylaxis    Throat closes  Has patient had a PCN reaction causing immediate rash, facial/tongue/throat swelling, SOB or lightheadedness with hypotension: Yes Has patient had a PCN reaction  causing severe rash involving mucus membranes or skin necrosis: Unknown Has patient had a PCN reaction that required hospitalization: Yes Has patient had a PCN reaction occurring within the last 10 years: No If all of the above answers are "NO", then may proceed with Cephalosporin use.     Past Medical History:  Diagnosis Date  . Hypertension   . TIA (transient ischemic attack) 04/20/12  . Tobacco abuse    Social History   Socioeconomic History  . Marital status: Married    Spouse name: Not on file  . Number of children: Not on file  . Years of education: Not on file  . Highest education level: Not on file  Occupational History  . Not on file  Social Needs  . Financial  resource strain: Not on file  . Food insecurity:    Worry: Not on file    Inability: Not on file  . Transportation needs:    Medical: Not on file    Non-medical: Not on file  Tobacco Use  . Smoking status: Current Every Day Smoker    Packs/day: 0.15    Types: Cigarettes  . Smokeless tobacco: Never Used  . Tobacco comment: Smoking 2 cigs per day  Substance and Sexual Activity  . Alcohol use: No    Alcohol/week: 0.6 oz    Types: 1 Standard drinks or equivalent per week  . Drug use: No  . Sexual activity: Not on file  Lifestyle  . Physical activity:    Days per week: Not on file    Minutes per session: Not on file  . Stress: Not on file  Relationships  . Social connections:    Talks on phone: Not on file    Gets together: Not on file    Attends religious service: Not on file    Active member of club or organization: Not on file    Attends meetings of clubs or organizations: Not on file    Relationship status: Not on file  . Intimate partner violence:    Fear of current or ex partner: Not on file    Emotionally abused: Not on file    Physically abused: Not on file    Forced sexual activity: Not on file  Other Topics Concern  . Not on file  Social History Narrative  . Not on file   Family History  Problem Relation Age of Onset  . Heart disease Mother   . Hypertension Mother   . Cancer Father    Past Surgical History:  Procedure Laterality Date  . CESAREAN SECTION    . cyst on thyroid Bilateral    As infant      Mardella LaymanHagler, Jermia Rigsby, MD 03/12/18 [phone]

## 2018-02-26 NOTE — ED Triage Notes (Signed)
Cough, nose bleed multiple times today.

## 2018-02-26 NOTE — ED Triage Notes (Signed)
Patient has an appointment with wellness center tomorrow

## 2018-02-26 NOTE — Discharge Instructions (Signed)
You may use over the counter AFRIN nasal spray sparingly should your nose bleed recur this evening.

## 2018-02-26 NOTE — ED Notes (Signed)
Pt discharged by provider.

## 2018-05-01 ENCOUNTER — Ambulatory Visit: Payer: BLUE CROSS/BLUE SHIELD

## 2018-05-12 MED FILL — HYDROCHLOROTHIAZIDE 25 MG T: 25 | 30 days supply | Qty: 30 | Fill #1

## 2018-05-12 MED FILL — AMLODIPINE BESYLATE 5 MG TA: 5 | 30 days supply | Qty: 30 | Fill #0

## 2018-05-12 MED FILL — CARVEDILOL 12.5 MG TABLET: 12.5 | 30 days supply | Qty: 60 | Fill #0

## 2018-05-12 MED FILL — ISOSORBIDE MN ER 60 MG TAB: 60 | 30 days supply | Qty: 30 | Fill #0

## 2018-12-11 IMAGING — US US RENAL
1 series · 14 of 25 positions shown · non-contrast
Comparison: None.

CLINICAL DATA: Acute renal insufficiency

EXAM:
RENAL / URINARY TRACT ULTRASOUND COMPLETE

[Series 1: us renal · 0.23mm/px · 14 of 28 slices shown]
[im 1/28]
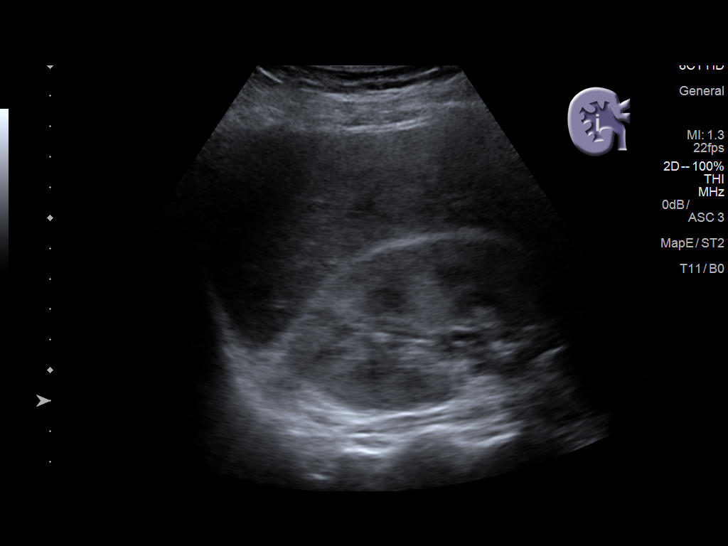
[im 3/28]
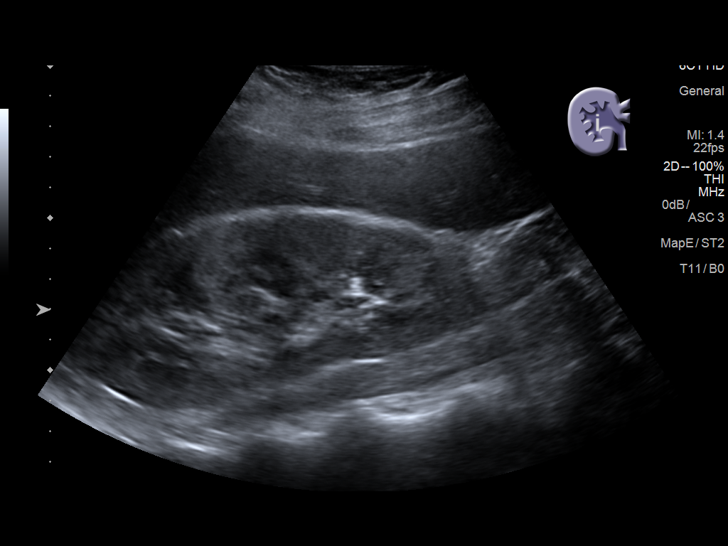
[im 5/28]
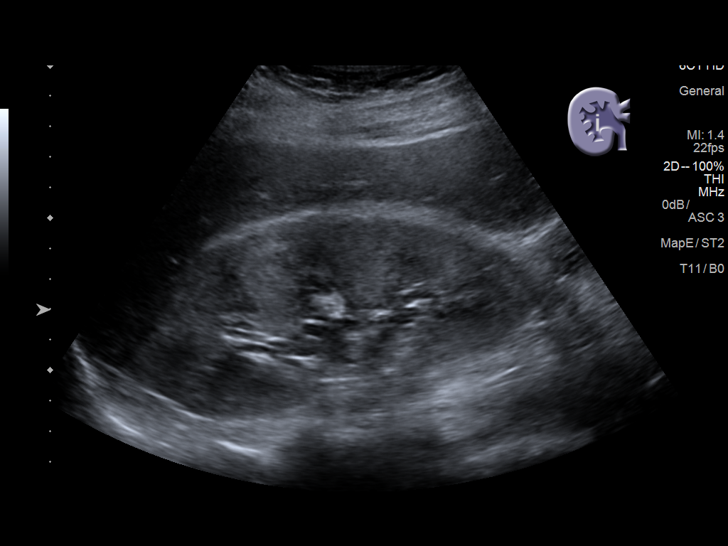
[im 7/28]
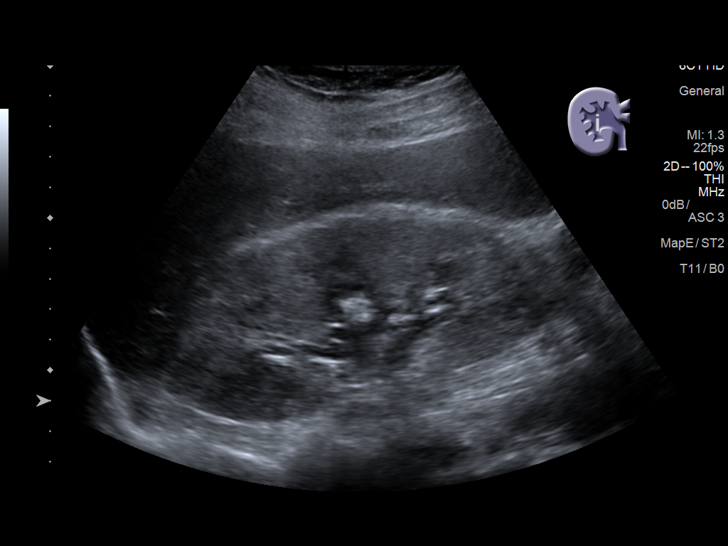
[im 10/28]
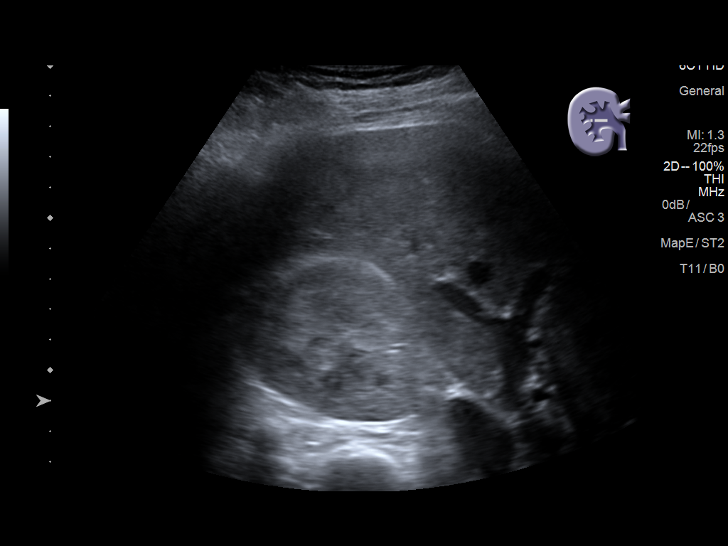
[im 11/28]
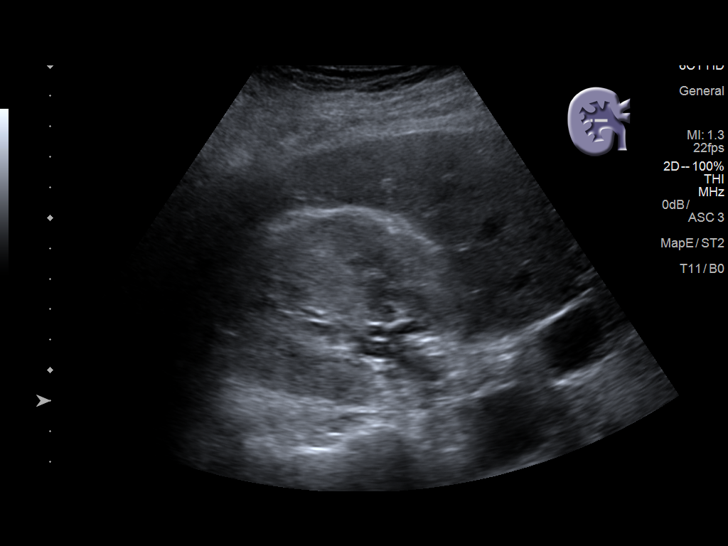
[im 13/28]
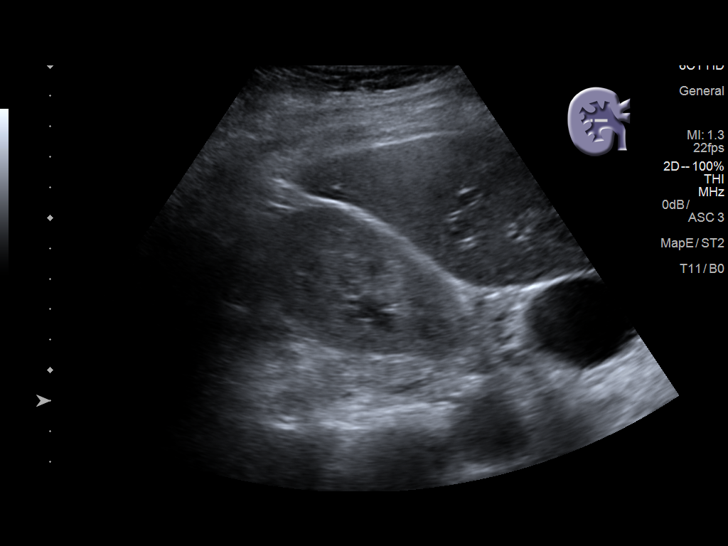
[im 15/28]
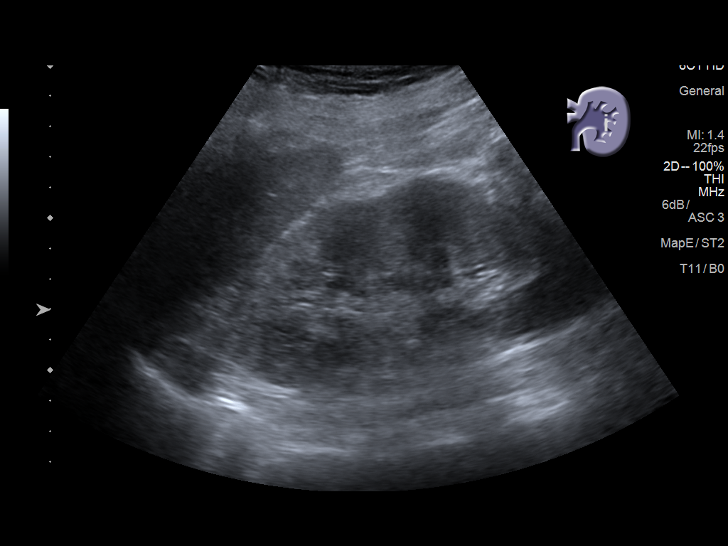
[im 17/28]
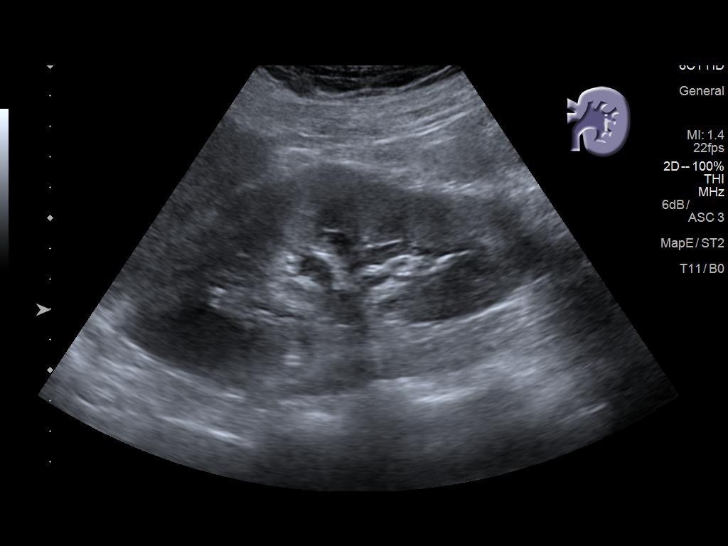
[im 19/28]
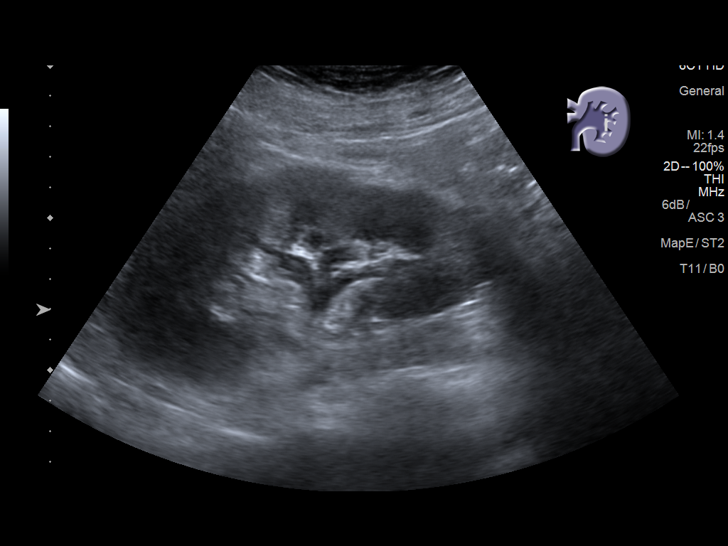
[im 21/28]
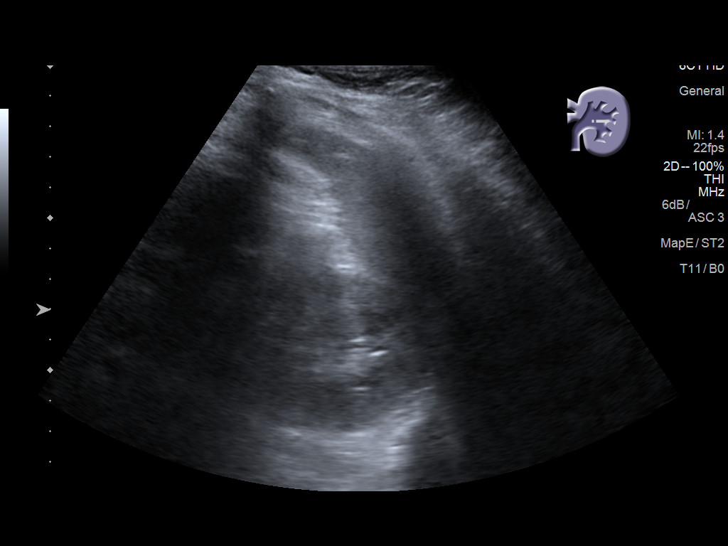
[im 23/28]
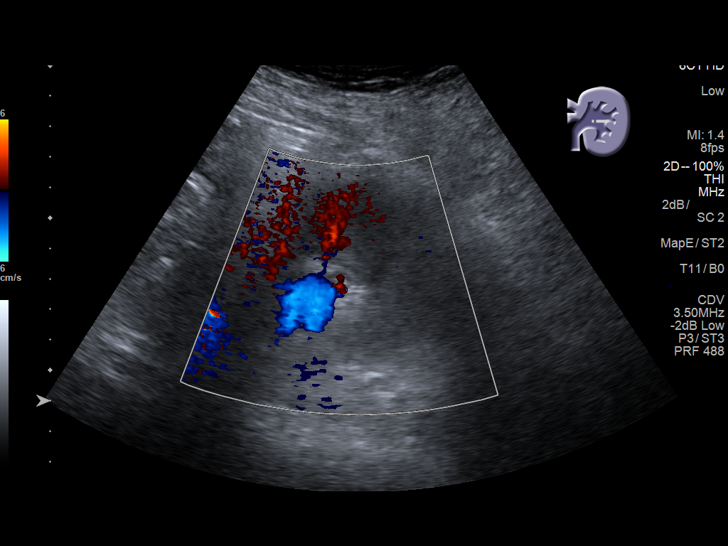
[im 25/28]
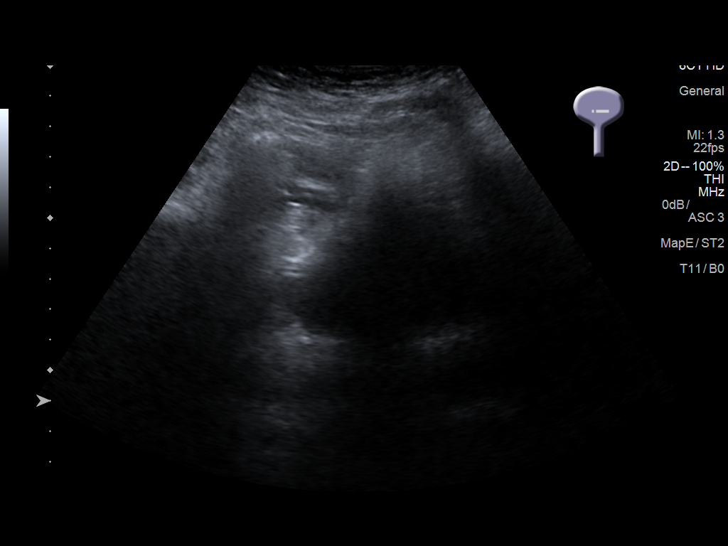
[im 28/28]
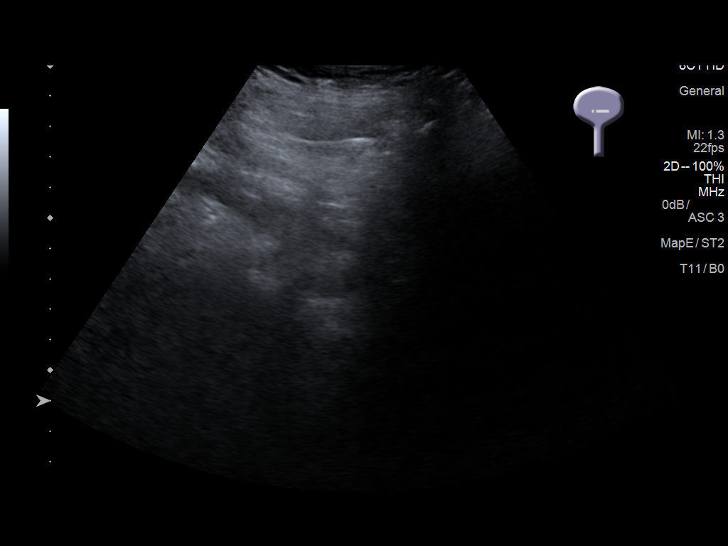

[14 of 25 positions shown; findings below may reference images not displayed]

FINDINGS: Right Kidney:

Length: 14.1 cm. Echogenicity is increased. Renal cortical thickness
is within normal limits. No mass or perinephric fluid visualized.
There is slight fullness of the right renal collecting system. No
sonographically demonstrable calculus or ureterectasis on either
side.

Left Kidney:

Length: 14.0 cm. Echogenicity is increased. Renal cortical thickness
is within normal limits. No mass or perinephric fluid visualized.
There is slight fullness of the left renal collecting system. No
sonographically demonstrable calculus or ureterectasis.

Bladder:

Appears normal for degree of bladder distention. Urinary bladder is
nearly empty.
IMPRESSION: Increased renal echogenicity, a finding that may be indicative of a
degree of medical renal disease. Renal cortical thickness is within
normal limits.

There is slight fullness of each renal collecting system without
obstructing focus seen on either side. The etiology for this mild
bilateral collecting system fullness is uncertain.

## 2019-02-09 IMAGING — DX DG CHEST 1V PORT SAME DAY
1 series · 1 of 1 positions shown · non-contrast
Comparison: 05/02/2017

CLINICAL DATA: Dyspnea.

EXAM:
PORTABLE CHEST 1 VIEW

[chest ap]
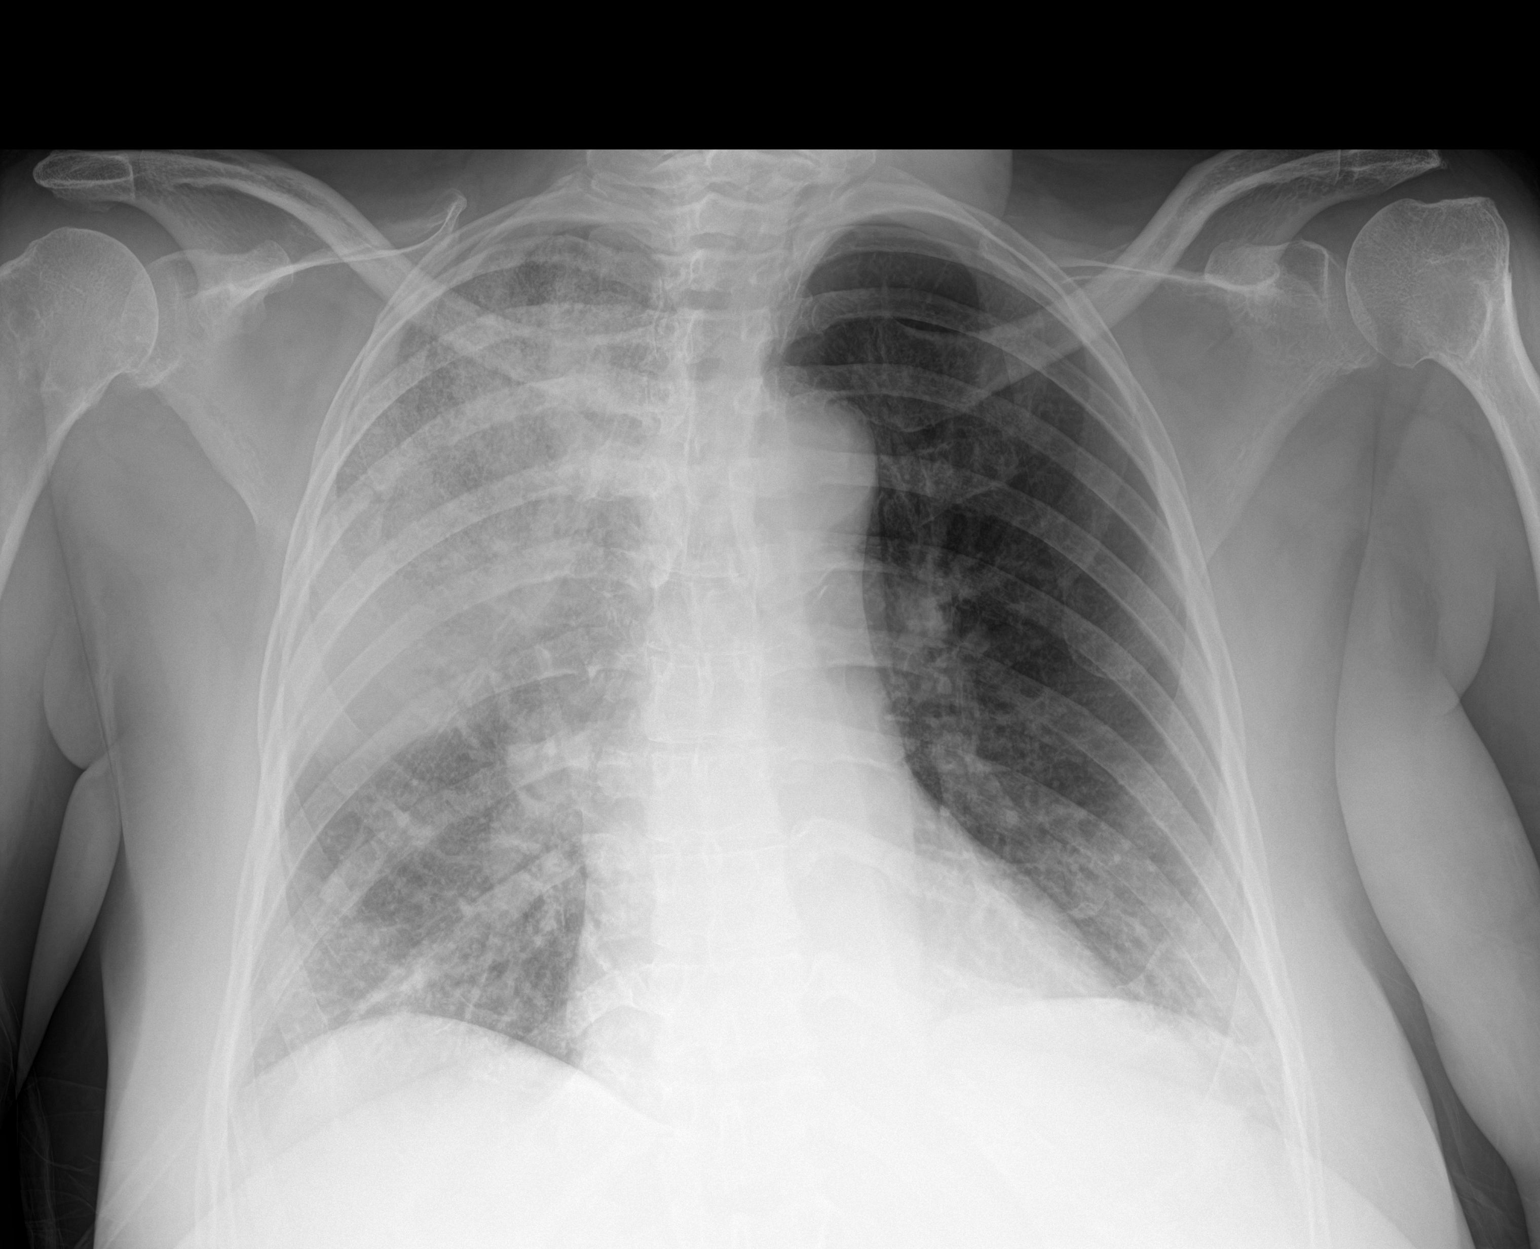

[1 of 1 positions shown; findings below may reference images not displayed]

FINDINGS: Midline trachea. Normal heart size. No pleural effusion or
pneumothorax. Slight worsening right upper lobe airspace disease.
There may be mild right infrahilar patchy airspace disease as well.
Minimal left base volume loss.
IMPRESSION: Worsened right-sided aeration, most consistent with infection or
aspiration. Recommend radiographic follow-up until clearing.
